# Patient Record
Sex: Male | Born: 1935 | Race: White | Hispanic: No | Marital: Single | Smoking: Never smoker
Health system: Southern US, Community
[De-identification: ages and names within clinical notes are randomized; demographics above are authoritative.]

## PROBLEM LIST (undated history)

## (undated) DIAGNOSIS — I10 Essential (primary) hypertension: Secondary | ICD-10-CM

## (undated) DIAGNOSIS — I509 Heart failure, unspecified: Secondary | ICD-10-CM

## (undated) DIAGNOSIS — E785 Hyperlipidemia, unspecified: Secondary | ICD-10-CM

## (undated) HISTORY — DX: Essential (primary) hypertension: I10

## (undated) HISTORY — DX: Heart failure, unspecified: I50.9

## (undated) HISTORY — DX: Hyperlipidemia, unspecified: E78.5

---

## 2020-12-10 ENCOUNTER — Other Ambulatory Visit: Payer: Self-pay

## 2020-12-10 ENCOUNTER — Ambulatory Visit (INDEPENDENT_AMBULATORY_CARE_PROVIDER_SITE_OTHER): Payer: Medicare HMO | Admitting: Family Medicine

## 2020-12-10 ENCOUNTER — Encounter: Payer: Self-pay | Admitting: Family Medicine

## 2020-12-10 VITALS — BP 118/72 | HR 76 | Wt 234.0 lb

## 2020-12-10 DIAGNOSIS — E785 Hyperlipidemia, unspecified: Secondary | ICD-10-CM

## 2020-12-10 DIAGNOSIS — Z7689 Persons encountering health services in other specified circumstances: Secondary | ICD-10-CM | POA: Diagnosis not present

## 2020-12-10 DIAGNOSIS — R739 Hyperglycemia, unspecified: Secondary | ICD-10-CM

## 2020-12-10 DIAGNOSIS — I10 Essential (primary) hypertension: Secondary | ICD-10-CM | POA: Insufficient documentation

## 2020-12-10 DIAGNOSIS — Z23 Encounter for immunization: Secondary | ICD-10-CM | POA: Diagnosis not present

## 2020-12-10 DIAGNOSIS — R7303 Prediabetes: Secondary | ICD-10-CM

## 2020-12-10 DIAGNOSIS — R4589 Other symptoms and signs involving emotional state: Secondary | ICD-10-CM

## 2020-12-10 DIAGNOSIS — I5020 Unspecified systolic (congestive) heart failure: Secondary | ICD-10-CM

## 2020-12-10 NOTE — Assessment & Plan Note (Signed)
PHQ-9 score of 11.  Patient feels that he can manage his mood fine with talking to his son.  Declines therapy.  Resources provided should he change his mind.

## 2020-12-10 NOTE — Assessment & Plan Note (Signed)
On statin. - lipid panel

## 2020-12-10 NOTE — Assessment & Plan Note (Addendum)
Well-controlled at today's visit, will continue current regimen. - BMP

## 2020-12-10 NOTE — Assessment & Plan Note (Signed)
Recently diagnosed, EF 40% per son.  On appropriate treatment with GDMT, though interestingly is not on ACEi/ARB.  We will explore this further at follow-up visit. - continue current medications - handicap placard form filled out - patient to schedule RN visit for ambulatory pulse ox to evaluate need for home O2 - referral placed to heart failure clinic

## 2020-12-10 NOTE — Assessment & Plan Note (Signed)
History of prediabetes per son, on glipizide. - A1c - continue glipizide

## 2020-12-10 NOTE — Addendum Note (Signed)
Addended by: Georges Lynch T on: 12/10/2020 12:53 PM   Modules accepted: Orders, SmartSet

## 2020-12-10 NOTE — Patient Instructions (Addendum)
It was nice seeing you today!  I have sent a referral for the heart failure clinic.  Below are some therapy resources if you change your mind.  You can schedule a nurse visit for an oxygen test.  Please schedule a separate follow-up appointment with me in 2 weeks.  Stay well, Tristan Deeds, MD [facility] [phone]    Therapy and Counseling Resources Most providers on this list will take Medicaid. Patients with commercial insurance or Medicare should contact their insurance company to get a list of in network providers.  [facility]:Psychiatry and Counseling [address] [place], [place] [address] [phone]  [facility]  [address] Suite [place], [place] [address]      [phone]  [facility] [address]  [place], [place] [address] [phone]  [facility] [address]  [place], [place] [address]       [phone]      Jovita Kussmaul [facility] [address], [place], [place] [phone]  [facility]:  [address] [place] [phone]  [facility]:  [address] [place] [phone]  [facility] (under & uninsured) [address]   [place] [phone]    [email or web address]    [facility] [address] . [place]    [phone]  [facility] -[address]     Phone:  [phone]     [facility]  [address]  [phone]   [facility] [address]      [place], [place] [phone]  [facility]: [address] [place], [place], [place]  [phone]   [facility] (Spanish therapist) [email or web address]  [address]   [place] [address]    [phone]    [facility]   [address],   [place], [place]  [phone]   [facility]  [address] [place]  [phone]  [facility]  [address] [place], [place]        [phone]  Open Access/Walk In Clinic under & uninsured  [facility]  [address] [place], [place] [phone] Crisis [phone]  [facility],  (Spanish)   [address], [place]: [phone]) 8:30 - 12; 1 - 2:30  [facility] [employer],  [address] [place], [place]    ([phone]):8:30 - 12; 2 - 3PM  [facility] [employer],  [address]  [place]; [phone]):   Mon - Fri 8 AM - 5 PM  [facility] [address] [place]  MWF 12:30 to 3:00 or call to schedule an appointment  [phone]  Specific Provider options Psychology Today  [email or web address].psychologytoday.com/us 1. click on find a therapist  2. enter your zip code 3. left side and select or tailor a therapist for your specific need.   [facility] Provider Directory [email or web address]  (Medicaid)   Follow all drop down to find a provider  Social Support program [facility] [phone] or [employer] [address], [place], [place] Recovery support and educational   24- Hour Availability:  .  Marland Kitchen [facility]  . [address] [place], [place] [employer] [phone]  Crisis [phone]  . [facility] [phone]  [facility] Crisis Service  [phone]   . [facility] [employer]  [phone] (after hours)  . Therapeutic Alternative/Mobile Crisis   [phone]  . [place] National Suicide Hotline  8323532569 (TALK)  . Call 911 or go to emergency room  . [employer]  [phone]);  [place] and [employer]   . [facility]  [phone]); [place], [place], [place], [place], [place],  [place], [place]

## 2020-12-10 NOTE — Progress Notes (Signed)
SUBJECTIVE:   CHIEF COMPLAINT / HPI: Establish care  Recently moved from [place] to stay with son, [name] due to health issues.  Previously was living alone.  CHF Medications: Metoprolol succinate 50 mg, furosemide 20 mg, spironolactone 25 mg, hydralazine 10 mg 3 times daily, isosorbide dinitrate 5 mg 3 times daily Diagnosed earlier this year in September 2021, was hospitalized for this Son believes his EF was 40% Son has been diligent about sodium intake He was not seeing an outpatient cardiologist before he moved but would've established with one if he had stayed He has been trying to establish with the heart failure clinic here, but has not received a call back yet - requesting referral Has had a handicap placard for the past few years due to inability to walk more than 100 yards without becoming short of breath - requesting handicap placard Requesting home O2 - states he felt pretty good on oxygen while in the hospital, but feeling more short of breath after being discharged from the hospital Requesting cheaper alternative to isosorbide dinitrate, 90 pills apparently cost $140 for them  Depressed mood Patient reporting little interest or pleasure in doing things as well as feeling down, depressed, hopeless He feels frustrated about his decline in functional status over the past 1 year due to medical conditions He has a great relationship with his son and is open to talking to him about things Son has been trying to get him involved more in activities and not letting him lay around the house too much Not interested in therapy at this time  Social history Lives at home with adult son Denies smoking, alcohol, and recreational drug use No regular exercise  PERTINENT  PMH / PSH: CHF, HTN, HLD, MI  OBJECTIVE:   BP 118/72   Pulse 76   Wt 234 lb (106.1 kg)   SpO2 94%   General: Elderly male, NAD CV: RRR, no murmurs Pulm: CTAB, no wheezes or rales Ext: 2+ pitting edema up to  knees bilaterally  Depression screen [facility] 2/9 12/10/2020  Decreased Interest 2  Down, Depressed, Hopeless 1  PHQ - 2 Score 3  Altered sleeping 3  Tired, decreased energy 3  Change in appetite 2  Feeling bad or failure about yourself  0  Trouble concentrating 0  Moving slowly or fidgety/restless 0  Suicidal thoughts 0  PHQ-9 Score 11  Difficult doing work/chores Not difficult at all     ASSESSMENT/PLAN:   HFrEF (heart failure with reduced ejection fraction) (HCC) Recently diagnosed, EF 40% per son.  On appropriate treatment with GDMT, though interestingly is not on ACEi/ARB.  We will explore this further at follow-up visit. - continue current medications - handicap placard form filled out - patient to schedule RN visit for ambulatory pulse ox to evaluate need for home O2 - referral placed to heart failure clinic  Primary hypertension Well-controlled at today's visit, will continue current regimen. - BMP  Pre-diabetes History of prediabetes per son, on glipizide. - A1c - continue glipizide  Depressed mood PHQ-9 score of 11.  Patient feels that he can manage his mood fine with talking to his son.  Declines therapy.  Resources provided should he change his mind.  Hyperlipidemia On statin. - lipid panel   HCM - flu shot given - PCV13 given - covid vaccine declined, patient will consider (he was told by previous doctors not to get it due to his heart) - Tdap about 5 years ago per patient, will obtain  records  F/u 2 weeks  Littie Deeds, MD [facility]

## 2020-12-11 LAB — BASIC METABOLIC PANEL
BUN/Creatinine Ratio: 13 (ref 10–24)
BUN: 22 mg/dL (ref 8–27)
CO2: 25 mmol/L (ref 20–29)
Calcium: 9.5 mg/dL (ref 8.6–10.2)
Chloride: 102 mmol/L (ref 96–106)
Creatinine, Ser: 1.73 mg/dL — ABNORMAL HIGH (ref 0.76–1.27)
GFR calc Af Amer: 42 mL/min/{1.73_m2} — ABNORMAL LOW (ref >59)
GFR calc non Af Amer: 36 mL/min/{1.73_m2} — ABNORMAL LOW (ref >59)
Glucose: 132 mg/dL — ABNORMAL HIGH (ref 65–99)
Potassium: 4.4 mmol/L (ref 3.5–5.2)
Sodium: 141 mmol/L (ref 134–144)

## 2020-12-11 LAB — LIPID PANEL
Chol/HDL Ratio: 2.2 ratio (ref 0.0–5.0)
Cholesterol, Total: 108 mg/dL (ref 100–199)
HDL: 50 mg/dL (ref >39)
LDL Chol Calc (NIH): 47 mg/dL (ref 0–99)
Triglycerides: 39 mg/dL (ref 0–149)
VLDL Cholesterol Cal: 11 mg/dL (ref 5–40)

## 2020-12-11 LAB — HEMOGLOBIN A1C
Est. average glucose Bld gHb Est-mCnc: 123 mg/dL
Hgb A1c MFr Bld: 5.9 % — ABNORMAL HIGH (ref 4.8–5.6)

## 2020-12-15 ENCOUNTER — Encounter: Payer: Self-pay | Admitting: Family Medicine

## 2021-01-02 ENCOUNTER — Ambulatory Visit (INDEPENDENT_AMBULATORY_CARE_PROVIDER_SITE_OTHER): Payer: Self-pay | Admitting: Family Medicine

## 2021-01-02 ENCOUNTER — Ambulatory Visit (INDEPENDENT_AMBULATORY_CARE_PROVIDER_SITE_OTHER): Payer: Self-pay

## 2021-01-02 ENCOUNTER — Encounter: Payer: Self-pay | Admitting: Family Medicine

## 2021-01-02 ENCOUNTER — Other Ambulatory Visit: Payer: Self-pay

## 2021-01-02 ENCOUNTER — Ambulatory Visit: Payer: Medicare HMO | Admitting: Family Medicine

## 2021-01-02 VITALS — BP 114/66 | HR 69 | Wt 226.8 lb

## 2021-01-02 VITALS — HR 60

## 2021-01-02 DIAGNOSIS — R7303 Prediabetes: Secondary | ICD-10-CM

## 2021-01-02 DIAGNOSIS — I5020 Unspecified systolic (congestive) heart failure: Secondary | ICD-10-CM

## 2021-01-02 DIAGNOSIS — E785 Hyperlipidemia, unspecified: Secondary | ICD-10-CM

## 2021-01-02 DIAGNOSIS — R7989 Other specified abnormal findings of blood chemistry: Secondary | ICD-10-CM

## 2021-01-02 MED ORDER — HYDRALAZINE HCL 10 MG PO TABS: 10.0000 mg | ORAL_TABLET | Freq: Three times a day (TID) | ORAL | 2 refills | Status: DC

## 2021-01-02 MED ORDER — ISOSORBIDE DINITRATE 5 MG PO TABS: 5.0000 mg | ORAL_TABLET | Freq: Three times a day (TID) | ORAL | 2 refills | Status: DC

## 2021-01-02 MED ORDER — FUROSEMIDE 40 MG PO TABS: 40.0000 mg | ORAL_TABLET | Freq: Every day | ORAL | 2 refills | Status: DC

## 2021-01-02 MED ORDER — SPIRONOLACTONE 25 MG PO TABS: 25.0000 mg | ORAL_TABLET | Freq: Every day | ORAL | 2 refills | Status: DC

## 2021-01-02 MED ORDER — GLIPIZIDE 5 MG PO TABS: 5.0000 mg | ORAL_TABLET | Freq: Every day | ORAL | 2 refills | Status: DC

## 2021-01-02 MED ORDER — METOPROLOL SUCCINATE ER 50 MG PO TB24: 50.0000 mg | ORAL_TABLET | Freq: Every day | ORAL | 2 refills | Status: DC

## 2021-01-02 MED ORDER — ATORVASTATIN CALCIUM 40 MG PO TABS: 40.0000 mg | ORAL_TABLET | Freq: Every day | ORAL | 2 refills | Status: DC

## 2021-01-02 NOTE — Patient Instructions (Addendum)
It was nice seeing you today!  I am increasing your Lasix to 40 mg daily.  This should help with the swelling in your legs.  I would recommend continuing to elevate her legs and to wear compression stockings as much as you can.  I have refilled your other medications.  I will update you with the results of your blood work.  Stay well, Littie Deeds, MD [facility] [phone]

## 2021-01-02 NOTE — Progress Notes (Signed)
Patient presents to nurse clinic with son for pulse ox screening for home O2.   Resting SpO2-97 % on RA Exertional SPO2 92-94% RA  Per insurance guidelines, patient would not qualify due to SpO2 not dropping below 88% on RA.   Patient has appointment today with Dr. Wynelle Link and will address further concerns for oxygen requirements with provider.   Tristan Prude, RN

## 2021-01-02 NOTE — Progress Notes (Signed)
    SUBJECTIVE:   CHIEF COMPLAINT / HPI:   HFrEF Medications: Metoprolol succinate 50 mg, furosemide 20 mg, spironolactone 25 mg, hydralazine 10 mg 3 times daily, isosorbide dinitrate 5 mg 3 times daily Diagnosed 08/2020, son believes EF was 40% Referred to heart failure clinic at last visit. First appt made 2/16. Requesting home O2, ambulatory O2 test today. Did not qualify for home O2.   PERTINENT  PMH / PSH: HFrEF, pre-diabetes, HTN  OBJECTIVE:   BP 114/66   Pulse 69   Wt 226 lb 12.8 oz (102.9 kg)   SpO2 94%   General: Elderly male, NAD CV: RRR, no murmurs Pulm: CTAB, no wheezes or rales Ext: WWP, 2+ pitting edema to knees bilaterally  ASSESSMENT/PLAN:   HFrEF (heart failure with reduced ejection fraction) (HCC) Still having some dyspnea though ambulatory oxygen test demonstrated no need for home supplemental oxygen at this time.  Is already on appropriate GDMT with the exception of ACE inhibitor/ARB.  Could consider adding on low-dose ACE inhibitor/ARB pending BMP.  On exam, he has significant pitting edema though no rales appreciated.  Would likely benefit from increased diuresis so we will go ahead and increase furosemide.  He has his initial appointment with the heart failure clinic in 1 month. - increase furosemide from 20 mg to 40 mg daily - otherwise continue current regimen   Elevated serum creatinine Cr 1.73 at last visit.  Patient with history of kidney stones but denies any known history of kidney disease.  Unclear if this represents AKI or new onset CKD.  Cardiorenal syndrome may be contributing given recently diagnosed HFrEF.  May need nephrology referral if kidney function does not improve, GFR in the 30s.  May potentially benefit from ACEi/ARB. - repeat BMP - UA  Littie Deeds, MD [facility]

## 2021-01-02 NOTE — Assessment & Plan Note (Signed)
Still having some dyspnea though ambulatory oxygen test demonstrated no need for home supplemental oxygen at this time.  Is already on appropriate GDMT with the exception of ACE inhibitor/ARB.  Could consider adding on low-dose ACE inhibitor/ARB pending BMP.  On exam, he has significant pitting edema though no rales appreciated.  Would likely benefit from increased diuresis so we will go ahead and increase furosemide.  He has his initial appointment with the heart failure clinic in 1 month. - increase furosemide from 20 mg to 40 mg daily - otherwise continue current regimen

## 2021-01-03 LAB — URINALYSIS, COMPLETE
Bilirubin, UA: NEGATIVE
Glucose, UA: NEGATIVE
Ketones, UA: NEGATIVE
Leukocytes,UA: NEGATIVE
Nitrite, UA: NEGATIVE
RBC, UA: NEGATIVE
Specific Gravity, UA: 1.016 (ref 1.005–1.030)
Urobilinogen, Ur: 0.2 mg/dL (ref 0.2–1.0)
pH, UA: 5.5 (ref 5.0–7.5)

## 2021-01-03 LAB — BASIC METABOLIC PANEL
BUN/Creatinine Ratio: 21 (ref 10–24)
BUN: 38 mg/dL — ABNORMAL HIGH (ref 8–27)
CO2: 24 mmol/L (ref 20–29)
Calcium: 9.2 mg/dL (ref 8.6–10.2)
Chloride: 106 mmol/L (ref 96–106)
Creatinine, Ser: 1.81 mg/dL — ABNORMAL HIGH (ref 0.76–1.27)
GFR calc Af Amer: 39 mL/min/{1.73_m2} — ABNORMAL LOW (ref >59)
GFR calc non Af Amer: 34 mL/min/{1.73_m2} — ABNORMAL LOW (ref >59)
Glucose: 96 mg/dL (ref 65–99)
Potassium: 4.7 mmol/L (ref 3.5–5.2)
Sodium: 144 mmol/L (ref 134–144)

## 2021-01-03 LAB — MICROALBUMIN / CREATININE URINE RATIO
Creatinine, Urine: 49.1 mg/dL
Microalb/Creat Ratio: 934 mg/g creat — ABNORMAL HIGH (ref 0–29)
Microalbumin, Urine: 458.5 ug/mL

## 2021-01-03 LAB — MICROSCOPIC EXAMINATION
Bacteria, UA: NONE SEEN
Casts: NONE SEEN /lpf
Epithelial Cells (non renal): NONE SEEN /hpf (ref 0–10)
RBC, Urine: NONE SEEN /hpf (ref 0–2)

## 2021-01-06 ENCOUNTER — Telehealth: Payer: Self-pay | Admitting: Family Medicine

## 2021-01-06 DIAGNOSIS — N1832 Chronic kidney disease, stage 3b: Secondary | ICD-10-CM | POA: Insufficient documentation

## 2021-01-06 MED ORDER — LOSARTAN POTASSIUM 25 MG PO TABS: 25.0000 mg | ORAL_TABLET | Freq: Every day | ORAL | 3 refills | Status: DC

## 2021-01-06 NOTE — Telephone Encounter (Signed)
Discussed lab results with patient and son [name].  Creatinine remains elevated with GFR 34 and microalbumin/creatinine ratio > 300, confirming that this patient has newly diagnosed CKD stage IIIb.  Given presence of proteinuria (as well as history of CHF), will initiate low-dose ARB for renal protection.  Additionally, since I have recently increased his furosemide, will plan to recheck electrolytes and kidney function in 1 week.  Also obtain CBC for baseline hemoglobin given new CKD.  Lab appointment scheduled for Thursday, February 3 at 8:30 AM.  I will also place referral for nephrology.

## 2021-01-15 ENCOUNTER — Other Ambulatory Visit: Payer: Medicare HMO

## 2021-01-15 ENCOUNTER — Other Ambulatory Visit: Payer: Self-pay

## 2021-01-15 DIAGNOSIS — N1832 Chronic kidney disease, stage 3b: Secondary | ICD-10-CM | POA: Diagnosis not present

## 2021-01-16 LAB — CBC
Hematocrit: 38.6 % (ref 37.5–51.0)
Hemoglobin: 12.8 g/dL — ABNORMAL LOW (ref 13.0–17.7)
MCH: 31.5 pg (ref 26.6–33.0)
MCHC: 33.2 g/dL (ref 31.5–35.7)
MCV: 95 fL (ref 79–97)
Platelets: 161 10*3/uL (ref 150–450)
RBC: 4.06 x10E6/uL — ABNORMAL LOW (ref 4.14–5.80)
RDW: 12.9 % (ref 11.6–15.4)
WBC: 6.2 10*3/uL (ref 3.4–10.8)

## 2021-01-16 LAB — RENAL FUNCTION PANEL
Albumin: 4 g/dL (ref 3.6–4.6)
BUN/Creatinine Ratio: 26 — ABNORMAL HIGH (ref 10–24)
BUN: 46 mg/dL — ABNORMAL HIGH (ref 8–27)
CO2: 23 mmol/L (ref 20–29)
Calcium: 9.3 mg/dL (ref 8.6–10.2)
Chloride: 103 mmol/L (ref 96–106)
Creatinine, Ser: 1.75 mg/dL — ABNORMAL HIGH (ref 0.76–1.27)
GFR calc Af Amer: 41 mL/min/{1.73_m2} — ABNORMAL LOW (ref >59)
GFR calc non Af Amer: 35 mL/min/{1.73_m2} — ABNORMAL LOW (ref >59)
Glucose: 111 mg/dL — ABNORMAL HIGH (ref 65–99)
Phosphorus: 3.3 mg/dL (ref 2.8–4.1)
Potassium: 4.3 mmol/L (ref 3.5–5.2)
Sodium: 141 mmol/L (ref 134–144)

## 2021-01-20 ENCOUNTER — Encounter: Payer: Self-pay | Admitting: Family Medicine

## 2021-01-27 NOTE — Progress Notes (Signed)
ADVANCED HF CLINIC CONSULT NOTE  Referring Physician: Dr. Wynelle Link Primary Care: Littie Deeds, MD Primary Cardiologist: New  HPI:  [name] is an 85 y/o male with HTN, DM2, HF referred by Dr. Wynelle Link for further evaluation of his HF.   At then end of 2021 moved from [place] to stay with son, [name] due to health issues.  Previously was living alone.  Apparently was diagnosed with HF in 9/21. Was in hospital for 1-2 weeks. Had a heart cath and cMRI.  EF ~40%. I do not have any records to give me the details. Says they were talking about CABG but decided he was not a good candidate and they wanted to pursue medical therapy.   Here with his son. Says he feels ok. Gets tired easily but is building his stamina steadily. No CP. SOB with mild activity. No edema, orthopnea or PND. CBGs around 120-130    Review of Systems: [y] = yes, [ ]  = no   General: Weight gain [ ] ; Weight loss [ ] ; Anorexia [ ] ; Fatigue ]; Fever [ ] ; Chills [ ] ; Weakness [ ]   Cardiac: Chest pain/pressure [ ] ; Resting SOB [ ] ; Exertional SOB [ y]; Orthopnea [ ] ; Pedal Edema [ ] ; Palpitations [ ] ; Syncope [ ] ; Presyncope [ ] ; Paroxysmal nocturnal dyspnea[ ]   Pulmonary: Cough [ ] ; Wheezing[ ] ; Hemoptysis[ ] ; Sputum [ ] ; Snoring [ ]   GI: Vomiting[ ] ; Dysphagia[ ] ; Melena[ ] ; Hematochezia [ ] ; Heartburn[ ] ; Abdominal pain [ ] ; Constipation [ ] ; Diarrhea [ ] ; BRBPR [ ]   GU: Hematuria[ ] ; Dysuria [ ] ; Nocturia[ ]   Vascular: Pain in legs with walking [ ] ; Pain in feet with lying flat [ ] ; Non-healing sores [ ] ; Stroke [ ] ; TIA [ ] ; Slurred speech [ ] ;  Neuro: Headaches[ ] ; Vertigo[ ] ; Seizures[ ] ; Paresthesias[ ] ;Blurred vision [ ] ; Diplopia [ ] ; Vision changes [ ]   Ortho/Skin: Arthritis [y]; Joint pain ]; Muscle pain [ ] ; Joint swelling [ ] ; Back Pain [ ] ; Rash [ ]   Psych: Depression[ ] ; Anxiety[ ]   Heme: Bleeding problems [ ] ; Clotting disorders [ ] ; Anemia [ ]   Endocrine: Diabetes ]; Thyroid dysfunction[ ]    Past Medical  History:  Diagnosis Date  . Congestive heart failure (CHF) (HCC)   . Hyperlipidemia   . Hypertension     Current Outpatient Medications  Medication Sig Dispense Refill  . atorvastatin (LIPITOR) 40 MG tablet Take 1 tablet (40 mg total) by mouth at bedtime. 90 tablet 2  . furosemide (LASIX) 40 MG tablet Take 1 tablet (40 mg total) by mouth daily. 30 tablet 2  . glipiZIDE (GLUCOTROL) 5 MG tablet Take 1 tablet (5 mg total) by mouth daily. 30 tablet 2  . hydrALAZINE (APRESOLINE) 10 MG tablet Take 1 tablet (10 mg total) by mouth 3 (three) times daily. 90 tablet 2  . isosorbide dinitrate (ISORDIL) 5 MG tablet Take 1 tablet (5 mg total) by mouth 3 (three) times daily. 90 tablet 2  . losartan (COZAAR) 25 MG tablet Take 1 tablet (25 mg total) by mouth at bedtime. 90 tablet 3  . metoprolol succinate (TOPROL-XL) 50 MG 24 hr tablet Take 1 tablet (50 mg total) by mouth daily. 30 tablet 2  . spironolactone (ALDACTONE) 25 MG tablet Take 1 tablet (25 mg total) by mouth daily. 30 tablet 2   No current facility-administered medications for this encounter.    Not on File    Social History  Socioeconomic History  . Marital status: Single    Spouse name: Not on file  . Number of children: Not on file  . Years of education: Not on file  . Highest education level: Not on file  Occupational History  . Not on file  Tobacco Use  . Smoking status: Never Smoker  . Smokeless tobacco: Never Used  Substance and Sexual Activity  . Alcohol use: Not Currently  . Drug use: Never  . Sexual activity: Not on file  Other Topics Concern  . Not on file  Social History Narrative  . Not on file   Social Determinants of Health   Financial Resource Strain: Not on file  Food Insecurity: Not on file  Transportation Needs: Not on file  Physical Activity: Not on file  Stress: Not on file  Social Connections: Not on file  Intimate Partner Violence: Not on file      Family History  Problem Relation Age of  Onset  . Diabetes Mother   . Hypertension Mother   . Alzheimer's disease Sister   . Diabetes Brother   . Hypertension Brother   . Stroke Brother   . Cancer Brother     Vitals:   01/28/21 1153  BP: 140/80  Pulse: 66  SpO2: 97%  Weight: 99 kg (218 lb 3.2 oz)    PHYSICAL EXAM: General:  Elderly. No respiratory difficulty HEENT: normal Neck: supple. JVP 9 + prominent cCV waves Carotids 2+ bilat; no bruits. No lymphadenopathy or thryomegaly appreciated. Cor: PMI nondisplaced. Regular rate & rhythm. No rubs, gallops or murmurs. Lungs: clear Abdomen: soft, nontender, nondistended. No hepatosplenomegaly. No bruits or masses. Good bowel sounds. Extremities: no cyanosis, clubbing, rash, 3+ edema Neuro: alert & oriented x 3, cranial nerves grossly intact. moves all 4 extremities w/o difficulty. Affect pleasant.  ECG: NSR 64 Anterolateral qs Personally reviewed   ASSESSMENT & PLAN:  1. 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 Appears to be due to iCM. Turned down for CABG -> medical therapy - EF 40% by report - NYHA III - but improving - Volume status markedly elevated on lasix 40 daily - Continue compression hose - Continue spiro 25 - Continue losartan 25 daily - Continue Toprol 50 daily - Continue hydralazine 10 tid  - Volume status markedly elevated - Will add Jardiance 10 and see if it helps with diuresis. If not will need to increase lasix - Refer to PharmD Clinic for med titration. Would start next week with stopping hydral and losartan and switching to Entresto 49/51 bid if renal function and BP permit - Check echo and get records from [place]  2. HTN - BP up. Changes as above  3. CAD - apparently has 3v CAD by cath 9/21 in [place]  - Turned down for CABG -> medical therapy - No s/s angina.  - Await records - Continue statin - Start ASA  3. DM2 - hgba1c 5.9% 12/21 - switch glipizide to Jardiance  4. CKD 3b  - last creatinine 1.7 -  start jardiance - recheck today - watch closely with diuresis - will need referral to [facility]    Total time spent 45 minutes. Over half that time spent discussing above.   Arvilla Meres, MD  10:27 PM

## 2021-01-28 ENCOUNTER — Encounter (HOSPITAL_COMMUNITY): Payer: Self-pay | Admitting: Internal Medicine

## 2021-01-28 ENCOUNTER — Ambulatory Visit (HOSPITAL_COMMUNITY)
Admission: RE | Admit: 2021-01-28 | Discharge: 2021-01-28 | Disposition: A | Discharge status: Home / Self Care | Payer: Medicare HMO | Source: Ambulatory Visit | Attending: Internal Medicine | Admitting: Internal Medicine

## 2021-01-28 ENCOUNTER — Other Ambulatory Visit: Payer: Self-pay

## 2021-01-28 VITALS — BP 140/80 | HR 66 | Wt 218.2 lb

## 2021-01-28 DIAGNOSIS — I5022 Chronic systolic (congestive) heart failure: Secondary | ICD-10-CM | POA: Diagnosis not present

## 2021-01-28 DIAGNOSIS — Z79899 Other long term (current) drug therapy: Secondary | ICD-10-CM | POA: Diagnosis not present

## 2021-01-28 DIAGNOSIS — E1122 Type 2 diabetes mellitus with diabetic chronic kidney disease: Secondary | ICD-10-CM | POA: Diagnosis not present

## 2021-01-28 DIAGNOSIS — I130 Hypertensive heart and chronic kidney disease with heart failure and stage 1 through stage 4 chronic kidney disease, or unspecified chronic kidney disease: Secondary | ICD-10-CM | POA: Diagnosis not present

## 2021-01-28 DIAGNOSIS — Z7984 Long term (current) use of oral hypoglycemic drugs: Secondary | ICD-10-CM | POA: Insufficient documentation

## 2021-01-28 DIAGNOSIS — I5020 Unspecified systolic (congestive) heart failure: Secondary | ICD-10-CM | POA: Diagnosis not present

## 2021-01-28 DIAGNOSIS — E785 Hyperlipidemia, unspecified: Secondary | ICD-10-CM | POA: Insufficient documentation

## 2021-01-28 DIAGNOSIS — I2510 Atherosclerotic heart disease of native coronary artery without angina pectoris: Secondary | ICD-10-CM

## 2021-01-28 DIAGNOSIS — Z8249 Family history of ischemic heart disease and other diseases of the circulatory system: Secondary | ICD-10-CM | POA: Diagnosis not present

## 2021-01-28 DIAGNOSIS — N1832 Chronic kidney disease, stage 3b: Secondary | ICD-10-CM | POA: Insufficient documentation

## 2021-01-28 DIAGNOSIS — I10 Essential (primary) hypertension: Secondary | ICD-10-CM | POA: Diagnosis not present

## 2021-01-28 LAB — BASIC METABOLIC PANEL
Anion gap: 8 (ref 5–15)
BUN: 39 mg/dL — ABNORMAL HIGH (ref 8–23)
CO2: 28 mmol/L (ref 22–32)
Calcium: 9.3 mg/dL (ref 8.9–10.3)
Chloride: 104 mmol/L (ref 98–111)
Creatinine, Ser: 1.81 mg/dL — ABNORMAL HIGH (ref 0.61–1.24)
GFR, Estimated: 37 mL/min — ABNORMAL LOW (ref >60)
Glucose, Bld: 109 mg/dL — ABNORMAL HIGH (ref 70–99)
Potassium: 4.9 mmol/L (ref 3.5–5.1)
Sodium: 140 mmol/L (ref 135–145)

## 2021-01-28 LAB — BRAIN NATRIURETIC PEPTIDE: B Natriuretic Peptide: 1548.7 pg/mL — ABNORMAL HIGH (ref 0.0–100.0)

## 2021-01-28 MED ORDER — LOSARTAN POTASSIUM 25 MG PO TABS: 25.0000 mg | ORAL_TABLET | Freq: Every day | ORAL | 2 refills | Status: DC

## 2021-01-28 MED ORDER — FUROSEMIDE 40 MG PO TABS: 40.0000 mg | ORAL_TABLET | Freq: Every day | ORAL | 2 refills | Status: DC

## 2021-01-28 MED ORDER — SPIRONOLACTONE 25 MG PO TABS: 25.0000 mg | ORAL_TABLET | Freq: Every day | ORAL | 2 refills | Status: DC

## 2021-01-28 MED ORDER — METOPROLOL SUCCINATE ER 50 MG PO TB24: 50.0000 mg | ORAL_TABLET | Freq: Every day | ORAL | 2 refills | Status: AC

## 2021-01-28 MED ORDER — ISOSORBIDE MONONITRATE ER 30 MG PO TB24: 30.0000 mg | ORAL_TABLET | Freq: Every day | ORAL | 2 refills | Status: AC

## 2021-01-28 MED ORDER — EMPAGLIFLOZIN 10 MG PO TABS: 10.0000 mg | ORAL_TABLET | Freq: Every day | ORAL | 3 refills | Status: DC

## 2021-01-28 MED ORDER — HYDRALAZINE HCL 10 MG PO TABS: 10.0000 mg | ORAL_TABLET | Freq: Three times a day (TID) | ORAL | 2 refills | Status: DC

## 2021-01-28 MED ORDER — EMPAGLIFLOZIN 10 MG PO TABS: 10.0000 mg | ORAL_TABLET | Freq: Every day | ORAL | 2 refills | Status: AC

## 2021-01-28 MED ORDER — ISOSORBIDE MONONITRATE ER 30 MG PO TB24: 30.0000 mg | ORAL_TABLET | Freq: Every day | ORAL | 3 refills | Status: DC

## 2021-01-28 MED ORDER — ATORVASTATIN CALCIUM 40 MG PO TABS: 40.0000 mg | ORAL_TABLET | Freq: Every day | ORAL | 2 refills | Status: AC

## 2021-01-28 NOTE — Addendum Note (Signed)
Encounter addended by: Dolores Patty, MD on: 01/28/2021 1:25 PM  Actions taken: Level of Service modified, Visit diagnoses modified

## 2021-01-28 NOTE — Patient Instructions (Addendum)
START Jardiance 10mg  (1 tablet) daily  STOP Glipizide   STOP Isurdil  START Imdur 30mg  (1 tablet) daily  Labs done today, your results will be available in MyChart, we will contact you for abnormal readings.  Your physician recommends that you schedule a follow-up appointment in: 1 week with pharmacy and an echocardiogram  Your physician has requested that you have an echocardiogram. Echocardiography is a painless test that uses sound waves to create images of your heart. It provides your doctor with information about the size and shape of your heart and how well your heart's chambers and valves are working. This procedure takes approximately one hour. There are no restrictions for this procedure.  Your physician recommends that you schedule a follow-up appointment in: 2 months with Dr.  If you have any questions or concerns before your next appointment please send a message through [facility] or call our office at [phone].    TO LEAVE A MESSAGE FOR THE NURSE SELECT OPTION 2, PLEASE LEAVE A MESSAGE INCLUDING: . YOUR NAME . DATE OF BIRTH . CALL BACK NUMBER . REASON FOR CALL**this is important as we prioritize the call backs  YOU WILL RECEIVE A CALL BACK THE SAME DAY AS LONG AS YOU CALL BEFORE 4:00 PM

## 2021-02-06 NOTE — Progress Notes (Signed)
Referring Physician: Dr. Wynelle Link Primary Care: Littie Deeds, MD HF Cardiologist: Dr. Gala Romney  HPI:  [name] is an 85 y/o male with HTN, DM2, HF referred by Dr. Wynelle Link for further evaluation of his HF.   At then end of 2021 moved from [place] to stay with son, [name] due to health issues. Previously was living alone.  Apparently was diagnosed with HF in 9/21. Was in hospital for 1-2 weeks. Had a heart cath and cMRI.  EF ~40%. I do not have any records to give me the details. Says they were talking about CABG but decided he was not a good candidate and they wanted to pursue medical therapy.   Recently presented to HF Clinic for initial visit with Dr. Gala Romney. Stated he felt ok. Reported getting tired easily but was building his stamina steadily. No CP. SOB with mild activity. No edema, orthopnea or PND. CBGs around 120-130   Today he returns to HF clinic for pharmacist medication titration. Here with his son. At last visit with MD, Jardiance 10 mg daily was initiated and glipizide was discontinued. Overall he is feeling well today. No dizziness, lightheadedness, chest pain or palpitations. Says he does feel fatigued, especially after walking around [place], but this has actually improved. No issues with SOB if he doesn't walk too fast. He used to require a wheelchair/cart at [employer] but now he can walk the entire trip without stopping. Weight at home has been decreasing, likely due to extra diuresis with Jardiance and improved diet. Weight is down 15 lbs since last visit. No longer eating TV dinners and has significantly decreased sodium intake. He takes furosemide 40 mg daily and has not needed any extra. Has been wearing compression stockings and this has been helpful. No LEE, PND or orthopnea. Taking all medications as prescribed and tolerating all medications.    HF Medications: Metoprolol succinate 50 mg daily Losartan 25 mg daily Spironolactone 25 mg daily Jardiance 10 mg  daily Furosemide 40 mg daily Hydralazine 10 mg TID Isosorbide mononitrate 30 mg daily  Has the patient been experiencing any side effects to the medications prescribed?  no  Does the patient have any problems obtaining medications due to transportation or finances?   Not currently. Has [employer]. Able to afford the Jardiance but is concerned about affordability when having to pay for that and Entresto (plan to start today, see plan below). Will apply for [employer] Patient Assistance today for [facility] and place him on PAN waitlist.   Understanding of regimen: good Understanding of indications: good Potential of compliance: good Patient understands to avoid NSAIDs. Patient understands to avoid decongestants.    Pertinent Lab Values: . 01/28/2021: Serum creatinine 1.81, BUN 39, Potassium 4.9, Sodium 140  Vital Signs: . Weight: 203.8 lbs (last clinic weight: 218.2 lbs) . Blood pressure: 140/72  . Heart rate: 70   Assessment/Plan: 1. 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 Appears to be due to iCM. Turned down for CABG -> medical therapy - EF 40% by report - NYHA III - but improving. Volume status improved, euvolemic on exam. Weight down 15 lbs from last visit.  - Continue compression hose - Decrease furosemide to 20 mg (1/2 tablet) daily.  - Continue metoprolol succinate 50 mg daily - Stop losartan and start Entresto 49/51 mg BID. Repeat BMET in 14 days.  - Continue spironolactone 25 mg daily - Stop hydralazine.  - Echo today pending - Return to Pharmacy Clinic in 2 weeks  for additional medication titration and BMET.   2. HTN - BP up. Changes as above  3. CAD - apparently has 3v CAD by cath 9/21 in [place]  - Turned down for CABG -> medical therapy - No s/s angina.  - Await records - Continue aspirin, statin.   3. DM2 - hgba1c 5.9% 11/2020 - Continue Jardiance 10 mg daily  4. CKD 3b  - last creatinine 1.8 - Continue jardiance  10 mg daily - watch closely with diuresis - Referred to [facility]    Karle Plumber, PharmD, BCPS, BCCP, CPP Heart Failure Clinic Pharmacist [phone]

## 2021-02-18 ENCOUNTER — Ambulatory Visit (HOSPITAL_BASED_OUTPATIENT_CLINIC_OR_DEPARTMENT_OTHER)
Admission: RE | Admit: 2021-02-18 | Discharge: 2021-02-18 | Disposition: A | Discharge status: Home / Self Care | Payer: Medicare HMO | Source: Ambulatory Visit

## 2021-02-18 ENCOUNTER — Ambulatory Visit (HOSPITAL_COMMUNITY)
Admission: RE | Admit: 2021-02-18 | Discharge: 2021-02-18 | Disposition: A | Discharge status: Home / Self Care | Payer: Medicare HMO | Source: Ambulatory Visit | Attending: Cardiology | Admitting: Cardiology

## 2021-02-18 ENCOUNTER — Telehealth (HOSPITAL_COMMUNITY): Payer: Self-pay | Admitting: Pharmacy Technician

## 2021-02-18 ENCOUNTER — Other Ambulatory Visit: Payer: Self-pay

## 2021-02-18 VITALS — BP 140/72 | HR 70 | Wt 203.8 lb

## 2021-02-18 DIAGNOSIS — I5020 Unspecified systolic (congestive) heart failure: Secondary | ICD-10-CM

## 2021-02-18 DIAGNOSIS — I5022 Chronic systolic (congestive) heart failure: Secondary | ICD-10-CM

## 2021-02-18 DIAGNOSIS — I313 Pericardial effusion (noninflammatory): Secondary | ICD-10-CM | POA: Diagnosis not present

## 2021-02-18 DIAGNOSIS — I700 Atherosclerosis of aorta: Secondary | ICD-10-CM | POA: Diagnosis not present

## 2021-02-18 DIAGNOSIS — E119 Type 2 diabetes mellitus without complications: Secondary | ICD-10-CM | POA: Insufficient documentation

## 2021-02-18 DIAGNOSIS — E785 Hyperlipidemia, unspecified: Secondary | ICD-10-CM | POA: Diagnosis not present

## 2021-02-18 DIAGNOSIS — I110 Hypertensive heart disease with heart failure: Secondary | ICD-10-CM | POA: Insufficient documentation

## 2021-02-18 LAB — ECHOCARDIOGRAM COMPLETE
Area-P 1/2: 7.44 cm2
S' Lateral: 4.8 cm
Single Plane A4C EF: 28.5 %

## 2021-02-18 MED ORDER — FUROSEMIDE 40 MG PO TABS: 20.0000 mg | ORAL_TABLET | Freq: Every day | ORAL | 2 refills | Status: AC

## 2021-02-18 MED ORDER — SACUBITRIL-VALSARTAN 49-51 MG PO TABS: 1.0000 | ORAL_TABLET | Freq: Two times a day (BID) | ORAL | 3 refills | Status: DC

## 2021-02-18 NOTE — Progress Notes (Signed)
  Echocardiogram 2D Echocardiogram has been performed.  Tristan Hart 02/18/2021, 1:27 PM

## 2021-02-18 NOTE — Patient Instructions (Addendum)
It was a pleasure seeing you today!  MEDICATIONS: -We are changing your medications today -Stop Hydralazine -Stop Losartan -Start Entresto 49/51 mg (1 tablet) twice daily - Decrease furosemide to 20 mg (1/2 tab) daily -Call if you have questions about your medications.   NEXT APPOINTMENT: Return to clinic in 2 weeks with Pharmacy Clinic.  In general, to take care of your heart failure: -Limit your fluid intake to 2 Liters (half-gallon) per day.   -Limit your salt intake to ideally 2-3 grams (2000-3000 mg) per day. -Weigh yourself daily and record, and bring that "weight diary" to your next appointment.  (Weight gain of 2-3 pounds in 1 day typically means fluid weight.) -The medications for your heart are to help your heart and help you live longer.   -Please contact us before stopping any of your heart medications.  Call the clinic at [phone] with questions or to reschedule future appointments.

## 2021-02-18 NOTE — Telephone Encounter (Signed)
Patient was seen in clinic today and started on Entresto. Started an Landscape [employer] for [employer] assistance.  Will fax in once signatures are obtained.

## 2021-02-19 NOTE — Progress Notes (Signed)
Referring Physician: Dr. Wynelle Link Primary Care: Littie Deeds, MD HF Cardiologist: Dr. Gala Romney  HPI:  Tristan Hart is an 85 y/o male with HTN, DM2, HF referred by Dr. Wynelle Link for further evaluation of his HF.   At then end of 2021 moved from [place] to stay with son, Tristan Hart due to health issues. Previously was living alone.  Apparently was diagnosed with HF in 08/2020. Was in hospital for 1-2 weeks. Had a heart cath and cMRI.  EF ~40%. I do not have any records to give me the details. Says they were talking about CABG but decided he was not a good candidate and they wanted to pursue medical therapy.   Recently presented to HF Clinic for initial visit with Dr. Gala Romney. Stated he felt ok. Reported getting tired easily but was building his stamina steadily. No CP. SOB with mild activity. No edema, orthopnea or PND. CBGs around 120-130   Recently presented to HF Clinic for pharmacist medication titration. Came with his son. At last visit with MD, Jardiance 10 mg daily was initiated and glipizide was discontinued. Overall he was feeling well. No dizziness, lightheadedness, chest pain or palpitations. Stated he does feel fatigued, especially after walking around [place], but this had actually improved. No issues with SOB if he doesn't walk too fast. He used to require a wheelchair/cart at [employer] but now he can walk the entire trip without stopping. Weight at home had been decreasing, likely due to extra diuresis with Jardiance and improved diet. Weight was down 15 lbs since last visit. No longer eating TV dinners and had significantly decreased sodium intake. He reported taking furosemide 40 mg daily and had not needed any extra. Had been wearing compression stockings and these have been helpful. No LEE, PND or orthopnea.   Today he returns to HF clinic for pharmacist medication titration. At last visit with pharmacy clinic, losartan was stopped and Entresto 49/51 mg BID was initiated. Additionally, furosemide  was decreased to 20 mg daily and hydralazine was discontinued. Unfortunately, he never decreased furosemide to 20 mg daily (forgot to make the change).  Overall he is doing well today. No dizziness, lightheadedness, chest pain or palpitations. No issues with SOB if he doesn't walk too fast. Has not needed any extra furosemide. No LEE, PND or orthopnea. Wears compression stockings.    HF Medications: Metoprolol succinate 50 mg daily Entresto 49/51 mg BID Spironolactone 25 mg daily Jardiance 10 mg daily Furosemide 20 mg daily - taking 40 mg daily Isosorbide mononitrate 30 mg daily  Has the patient been experiencing any side effects to the medications prescribed?  no  Does the patient have any problems obtaining medications due to transportation or finances?   Not currently. Has [employer]. Able to afford the Jardiance but is concerned about affordability when having to pay for that and Entresto (plan to start today, see plan below).  [employer] Patient Assistance for Sherryll Burger is pending. He was placed on PAN waitlist. Will apply for Jardiance patient assistance today.   Understanding of regimen: good Understanding of indications: good Potential of compliance: good Patient understands to avoid NSAIDs. Patient understands to avoid decongestants.    Pertinent Lab Values: . 01/28/2021: Serum creatinine 1.81, BUN 39, Potassium 4.9, Sodium 140, BNP 1548.7 . 03/02/2021: Serum creatinine 1.95, BUN 45, Potassium 5.5, Sodium 138, BNP 1932.8   Vital Signs: . Weight: 202 lbs (last clinic weight: 203.8 lbs) . Blood pressure: 138/64 . Heart rate: 59  Assessment/Plan: 1. 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 Appears to be due to iCM. Turned down for CABG -> medical therapy - EF 40% by report - Echo 02/18/2021 EF 25-30%, RV not well visualized. - NYHA III - but improving. Volume status improved, euvolemic-dry on exam.  - Labs returned once visit was completed. Scr  increased from 1.81 to 1.95, K increased from 4.9 to 5.5. BUN increased from 39 to 45. BNP increased from 1548.7 to 1932; however, I believe the BNP increase is due to starting [facility], as his other labs and physical exam indicate he may be slightly dry. K is elevated. He eats 1 banana every day. Will have him follow low K diet (stop eating bananas) and will decrease spironolactone (see below).  - Continue compression hose -HOLD furosemide tomorrow, then decrease furosemide to 20 mg (1/2 tablet) daily. Did not decrease furosemide from 40 mg daily to 20 mg daily as instructed last week. Repeat BMET in 1 week.  - Continue metoprolol succinate 50 mg daily - Increase Entresto to 97/103 mg BID. If K and Scr have not improved on lab work next week, will consider decreasing back to 49/51 mg daily. Will continue the increased dose of Entresto for now as patient can become overwhelmed with multiple medication changes.  - Decrease spironolactone to 12.5 mg daily. Repeat BMET in 1 week.  - Return to HF Clinic on 04/01/2021 with Dr. Gala Romney.   2. HTN - BP up. Changes as above  3. CAD - apparently has 3v CAD by cath 9/21 in [place]  - Turned down for CABG -> medical therapy - No s/s angina.  - Await records - Continue aspirin, statin.   3. DM2 - hgba1c 5.9% 11/2020 - Continue Jardiance 10 mg daily  4. CKD 3b  - Scr 1.95 today, changes as above - Continue jardiance 10 mg daily - watch closely with diuresis - Referred to [facility]    Karle Plumber, PharmD, BCPS, BCCP, CPP Heart Failure Clinic Pharmacist [phone]

## 2021-02-20 NOTE — Telephone Encounter (Signed)
Sent in application via fax.  Will follow up.  

## 2021-03-02 ENCOUNTER — Ambulatory Visit (HOSPITAL_COMMUNITY)
Admission: RE | Admit: 2021-03-02 | Discharge: 2021-03-02 | Disposition: A | Discharge status: Home / Self Care | Payer: Medicare HMO | Source: Ambulatory Visit | Attending: Internal Medicine | Admitting: Internal Medicine

## 2021-03-02 ENCOUNTER — Other Ambulatory Visit: Payer: Self-pay

## 2021-03-02 VITALS — BP 138/64 | HR 59 | Wt 202.0 lb

## 2021-03-02 DIAGNOSIS — E1122 Type 2 diabetes mellitus with diabetic chronic kidney disease: Secondary | ICD-10-CM | POA: Diagnosis not present

## 2021-03-02 DIAGNOSIS — I255 Ischemic cardiomyopathy: Secondary | ICD-10-CM | POA: Diagnosis not present

## 2021-03-02 DIAGNOSIS — I5022 Chronic systolic (congestive) heart failure: Secondary | ICD-10-CM | POA: Insufficient documentation

## 2021-03-02 DIAGNOSIS — N1832 Chronic kidney disease, stage 3b: Secondary | ICD-10-CM | POA: Insufficient documentation

## 2021-03-02 DIAGNOSIS — I2510 Atherosclerotic heart disease of native coronary artery without angina pectoris: Secondary | ICD-10-CM | POA: Insufficient documentation

## 2021-03-02 DIAGNOSIS — I130 Hypertensive heart and chronic kidney disease with heart failure and stage 1 through stage 4 chronic kidney disease, or unspecified chronic kidney disease: Secondary | ICD-10-CM | POA: Insufficient documentation

## 2021-03-02 LAB — BASIC METABOLIC PANEL
Anion gap: 7 (ref 5–15)
BUN: 45 mg/dL — ABNORMAL HIGH (ref 8–23)
CO2: 25 mmol/L (ref 22–32)
Calcium: 9 mg/dL (ref 8.9–10.3)
Chloride: 106 mmol/L (ref 98–111)
Creatinine, Ser: 1.95 mg/dL — ABNORMAL HIGH (ref 0.61–1.24)
GFR, Estimated: 34 mL/min — ABNORMAL LOW (ref >60)
Glucose, Bld: 186 mg/dL — ABNORMAL HIGH (ref 70–99)
Potassium: 5.5 mmol/L — ABNORMAL HIGH (ref 3.5–5.1)
Sodium: 138 mmol/L (ref 135–145)

## 2021-03-02 LAB — BRAIN NATRIURETIC PEPTIDE: B Natriuretic Peptide: 1932.8 pg/mL — ABNORMAL HIGH (ref 0.0–100.0)

## 2021-03-02 MED ORDER — SPIRONOLACTONE 25 MG PO TABS: 12.5000 mg | ORAL_TABLET | Freq: Every day | ORAL | 2 refills | Status: AC

## 2021-03-02 MED ORDER — SACUBITRIL-VALSARTAN 97-103 MG PO TABS: 1.0000 | ORAL_TABLET | Freq: Two times a day (BID) | ORAL | 3 refills | Status: DC

## 2021-03-02 NOTE — Patient Instructions (Addendum)
It was a pleasure seeing you today!  MEDICATIONS: -We are changing your medications today -Increase Entresto to 97/103 mg (1 tablet) twice daily.  You may take 2 tablets of the 49/51 mg strength twice daily until you receive the new strength.  -Call if you have questions about your medications.  LABS: -We will call you if your labs need attention.  NEXT APPOINTMENT: Return to clinic in 1 month with Dr. Gala Romney.  In general, to take care of your heart failure: -Limit your fluid intake to 2 Liters (half-gallon) per day.   -Limit your salt intake to ideally 2-3 grams (2000-3000 mg) per day. -Weigh yourself daily and record, and bring that "weight diary" to your next appointment.  (Weight gain of 2-3 pounds in 1 day typically means fluid weight.) -The medications for your heart are to help your heart and help you live longer.   -Please contact us before stopping any of your heart medications.  Call the clinic at [phone] with questions or to reschedule future appointments.

## 2021-03-04 ENCOUNTER — Telehealth (HOSPITAL_COMMUNITY): Payer: Self-pay | Admitting: Pharmacist

## 2021-03-04 NOTE — Telephone Encounter (Signed)
Sent in Manufacturer's Assistance application to [employer] for Jardiance.   Application pending, will continue to follow.  Itamar Mcgowan, PharmD, BCPS, BCCP, CPP Heart Failure Clinic Pharmacist [phone]  

## 2021-03-09 ENCOUNTER — Telehealth (HOSPITAL_COMMUNITY): Payer: Self-pay | Admitting: Pharmacy Technician

## 2021-03-09 NOTE — Telephone Encounter (Addendum)
Advanced Heart Failure Patient Advocate Encounter   Patient was approved to receive Entresto from [employer]  Patient ID: [id number] Effective dates: 02/27/21 through 12/12/21  Advanced Heart Failure Patient Advocate Encounter   Patient was approved to receive Jardiance from [employer]  Effective dates: 03/04/21 through 12/12/21  Called and left the patient message.   Archer Asa, CPhT

## 2021-03-10 ENCOUNTER — Other Ambulatory Visit (HOSPITAL_COMMUNITY): Payer: Medicare HMO

## 2021-03-12 ENCOUNTER — Telehealth (HOSPITAL_COMMUNITY): Payer: Self-pay | Admitting: Pharmacist

## 2021-03-12 MED ORDER — SACUBITRIL-VALSARTAN 97-103 MG PO TABS: 1.0000 | ORAL_TABLET | Freq: Two times a day (BID) | ORAL | 3 refills | Status: AC

## 2021-03-12 NOTE — Telephone Encounter (Signed)
Updated Entresto script sent to [facility] [employer] patient assistance patient).  Karle Plumber, PharmD, BCPS, BCCP, CPP Heart Failure Clinic Pharmacist [phone]

## 2021-03-17 ENCOUNTER — Other Ambulatory Visit: Payer: Self-pay | Admitting: Family Medicine

## 2021-03-31 NOTE — Progress Notes (Signed)
ADVANCED HF CLINIC NOTE  Referring Physician: Dr. Wynelle Link Primary Care: Littie Deeds, MD Primary Cardiologist: New  HPI:  [name] is an 85 y/o male with HTN, DM2, HF referred by Dr. Wynelle Link for further evaluation of his HF.   At then end of 2021 moved from [place] to stay with son, Tristan Hart due to health issues.  Previously was living alone.  I saw him for the first time in 2/22.  Was diagnosed with HF in 9/21. Was in hospital for 1-2 weeks. Had a heart cath and cMRI.  EF ~40%. I do not have any records to give me the details. Says they were talking about CABG but decided he was not a good candidate and they wanted to pursue medical therapy.   Has been following with PharmD Clinic and meds being titrated.   Here with his son. Says he feels better with medicine changes. Exercises 5 days per week. Yesterday did 3.5 miles on exercise bike ver 20-30 mins + hand weights. No CP or undue dyspnea. No problems with ADLs. Walks in store without problem. Denies dizziness. No orthopnea or PND.    Past Medical History:  Diagnosis Date  . Congestive heart failure (CHF) (HCC)   . Hyperlipidemia   . Hypertension     Current Outpatient Medications  Medication Sig Dispense Refill  . atorvastatin (LIPITOR) 40 MG tablet Take 1 tablet (40 mg total) by mouth at bedtime. 90 tablet 2  . Cholecalciferol (VITAMIN D3 PO) Take 1 tablet by mouth daily in the afternoon.    . co-enzyme Q-10 30 MG capsule Take 30 mg by mouth daily.    Marland Kitchen ELDERBERRY PO Take 1 capsule by mouth.    . empagliflozin (JARDIANCE) 10 MG TABS tablet Take 1 tablet (10 mg total) by mouth daily before breakfast. 90 tablet 2  . furosemide (LASIX) 40 MG tablet Take 0.5 tablets (20 mg total) by mouth daily. 45 tablet 2  . isosorbide mononitrate (IMDUR) 30 MG 24 hr tablet Take 1 tablet (30 mg total) by mouth daily. 90 tablet 2  . metoprolol succinate (TOPROL-XL) 50 MG 24 hr tablet Take 1 tablet (50 mg total) by mouth daily. 90 tablet 2  . Multiple  Vitamin (MULTIVITAMIN) capsule Take 1 capsule by mouth daily.    . Omega-3 Fatty Acids (FISH OIL) 1000 MG CAPS Take 1 capsule by mouth 2 (two) times daily.    . Probiotic Product (PROBIOTIC-10 PO) Take 1 capsule by mouth daily.    . sacubitril-valsartan (ENTRESTO) 97-103 MG Take 1 tablet by mouth 2 (two) times daily. 180 tablet 3  . spironolactone (ALDACTONE) 25 MG tablet Take 0.5 tablets (12.5 mg total) by mouth daily. 45 tablet 2  . vitamin E 180 MG (400 UNITS) capsule Take 400 Units by mouth daily.     No current facility-administered medications for this encounter.    Not on File    Social History   Socioeconomic History  . Marital status: Single    Spouse name: Not on file  . Number of children: Not on file  . Years of education: Not on file  . Highest education level: Not on file  Occupational History  . Not on file  Tobacco Use  . Smoking status: Never Smoker  . Smokeless tobacco: Never Used  Substance and Sexual Activity  . Alcohol use: Not Currently  . Drug use: Never  . Sexual activity: Not on file  Other Topics Concern  . Not on file  Social History  Narrative  . Not on file   Social Determinants of Health   Financial Resource Strain: Not on file  Food Insecurity: Not on file  Transportation Needs: Not on file  Physical Activity: Not on file  Stress: Not on file  Social Connections: Not on file  Intimate Partner Violence: Not on file      Family History  Problem Relation Age of Onset  . Diabetes Mother   . Hypertension Mother   . Alzheimer's disease Sister   . Diabetes Brother   . Hypertension Brother   . Stroke Brother   . Cancer Brother     Vitals:   04/01/21 1019  BP: 120/70  Pulse: 65  SpO2: 96%  Weight: 93.4 kg (206 lb)    PHYSICAL EXAM: General:  Well appearing. No resp difficulty HEENT: normal Neck: supple. no JVD. Carotids 2+ bilat; no bruits. No lymphadenopathy or thryomegaly appreciated. Cor: PMI nondisplaced. Regular rate &  rhythm. No rubs, gallops or murmurs. Lungs: clear Abdomen: soft, nontender, nondistended. No hepatosplenomegaly. No bruits or masses. Good bowel sounds. Extremities: no cyanosis, clubbing, rash, edema Neuro: alert & orientedx3, cranial nerves grossly intact. moves all 4 extremities w/o difficulty. Affect pleasant  ECG: NSR 64 Anterolateral qs Personally reviewed   ASSESSMENT & PLAN:  1. 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 Appears to be due to iCM. Turned down for CABG -> medical therapy - EF 40% by report - Echo 3//22 EF 25-30%  - Much improved NYHA II - Volume status much improved as well. Now on lasix 20 daily - Continue spiro 12.5 - Continue Entresto 97/103 bid - Continue Toprol 50 daily - Continue Jardiance 10  - At last visit K was 5.5. Spiro decreased and told to stop banana intake - He uses No-salt substitute. If has potassium in it will need to stop  - Repeat labs today - We discussed ICD today and given advanced age, have decided to not proceed with it now  2. HTN - Blood pressure well controlled. Continue current regimen.  3. CAD - apparently has 3v CAD by cath 9/21 in [place]  - Turned down for CABG -> medical therapy - No s/s angina - Await records - Continue statin & ECASA  3. DM2 - hgba1c 5.9% 12/21 - Continue Jardiance  4. CKD 3b  - Scr 1.7-1.9 - Continue Jardaince - Referred to [facility]. Has appt scheduled    Arvilla Meres, MD  10:48 AM

## 2021-04-01 ENCOUNTER — Ambulatory Visit (HOSPITAL_COMMUNITY)
Admission: RE | Admit: 2021-04-01 | Discharge: 2021-04-01 | Disposition: A | Discharge status: Home / Self Care | Payer: Medicare HMO | Source: Ambulatory Visit | Attending: Internal Medicine | Admitting: Internal Medicine

## 2021-04-01 ENCOUNTER — Other Ambulatory Visit: Payer: Self-pay

## 2021-04-01 ENCOUNTER — Encounter (HOSPITAL_COMMUNITY): Payer: Self-pay | Admitting: Internal Medicine

## 2021-04-01 VITALS — BP 120/70 | HR 65 | Wt 206.0 lb

## 2021-04-01 DIAGNOSIS — Z7984 Long term (current) use of oral hypoglycemic drugs: Secondary | ICD-10-CM | POA: Insufficient documentation

## 2021-04-01 DIAGNOSIS — I2510 Atherosclerotic heart disease of native coronary artery without angina pectoris: Secondary | ICD-10-CM | POA: Diagnosis not present

## 2021-04-01 DIAGNOSIS — I5022 Chronic systolic (congestive) heart failure: Secondary | ICD-10-CM | POA: Insufficient documentation

## 2021-04-01 DIAGNOSIS — Z8249 Family history of ischemic heart disease and other diseases of the circulatory system: Secondary | ICD-10-CM | POA: Insufficient documentation

## 2021-04-01 DIAGNOSIS — Z79899 Other long term (current) drug therapy: Secondary | ICD-10-CM | POA: Insufficient documentation

## 2021-04-01 DIAGNOSIS — I130 Hypertensive heart and chronic kidney disease with heart failure and stage 1 through stage 4 chronic kidney disease, or unspecified chronic kidney disease: Secondary | ICD-10-CM | POA: Insufficient documentation

## 2021-04-01 DIAGNOSIS — N1832 Chronic kidney disease, stage 3b: Secondary | ICD-10-CM | POA: Diagnosis not present

## 2021-04-01 DIAGNOSIS — I10 Essential (primary) hypertension: Secondary | ICD-10-CM

## 2021-04-01 DIAGNOSIS — E1122 Type 2 diabetes mellitus with diabetic chronic kidney disease: Secondary | ICD-10-CM | POA: Insufficient documentation

## 2021-04-01 LAB — BASIC METABOLIC PANEL
Anion gap: 6 (ref 5–15)
BUN: 43 mg/dL — ABNORMAL HIGH (ref 8–23)
CO2: 24 mmol/L (ref 22–32)
Calcium: 9.3 mg/dL (ref 8.9–10.3)
Chloride: 108 mmol/L (ref 98–111)
Creatinine, Ser: 1.81 mg/dL — ABNORMAL HIGH (ref 0.61–1.24)
GFR, Estimated: 37 mL/min — ABNORMAL LOW (ref >60)
Glucose, Bld: 232 mg/dL — ABNORMAL HIGH (ref 70–99)
Potassium: 5 mmol/L (ref 3.5–5.1)
Sodium: 138 mmol/L (ref 135–145)

## 2021-04-01 LAB — BRAIN NATRIURETIC PEPTIDE: B Natriuretic Peptide: 4414.8 pg/mL — ABNORMAL HIGH (ref 0.0–100.0)

## 2021-04-01 NOTE — Patient Instructions (Signed)
Your physician recommends that you schedule a follow-up appointment in: 4 months  If you have any questions or concerns before your next appointment please send us a message through mychart or call our office at [phone].    TO LEAVE A MESSAGE FOR THE NURSE SELECT OPTION 2, PLEASE LEAVE A MESSAGE INCLUDING: . YOUR NAME . DATE OF BIRTH . CALL BACK NUMBER . REASON FOR CALL**this is important as we prioritize the call backs  YOU WILL RECEIVE A CALL BACK THE SAME DAY AS LONG AS YOU CALL BEFORE 4:00 PM  At the [facility], you and your health needs are our priority. As part of our continuing mission to provide you with exceptional heart care, we have created designated Provider Care Teams. These Care Teams include your primary Cardiologist (physician) and Advanced Practice Providers (APPs- Physician Assistants and Nurse Practitioners) who all work together to provide you with the care you need, when you need it.   You may see any of the following providers on your designated Care Team at your next follow up: . Dr Daniel Bensimhon . Dr Dalton McLean . Dr Brandon Winfrey . Amy Clegg, NP . Brittainy Simmons, PA . Jessica Milford,NP . Lauren Kemp, PharmD   Please be sure to bring in all your medications bottles to every appointment.     

## 2021-04-01 NOTE — Addendum Note (Signed)
Encounter addended by: Noralee Space, RN on: 04/01/2021 11:19 AM  Actions taken: Clinical Note Signed

## 2021-04-01 NOTE — Addendum Note (Signed)
Encounter addended by: Noralee Space, RN on: 04/01/2021 11:52 AM  Actions taken: Order list changed, Diagnosis association updated

## 2021-07-08 ENCOUNTER — Inpatient Hospital Stay (HOSPITAL_COMMUNITY)
Admission: EM | Admit: 2021-07-08 | Discharge: 2021-08-13 | DRG: 064 | Disposition: E | Payer: Medicare HMO | Attending: Critical Care Medicine | Admitting: Critical Care Medicine

## 2021-07-08 ENCOUNTER — Emergency Department (HOSPITAL_COMMUNITY): Payer: Medicare HMO

## 2021-07-08 DIAGNOSIS — Z781 Physical restraint status: Secondary | ICD-10-CM

## 2021-07-08 DIAGNOSIS — E669 Obesity, unspecified: Secondary | ICD-10-CM | POA: Diagnosis present

## 2021-07-08 DIAGNOSIS — Z6829 Body mass index (BMI) 29.0-29.9, adult: Secondary | ICD-10-CM

## 2021-07-08 DIAGNOSIS — R4182 Altered mental status, unspecified: Secondary | ICD-10-CM | POA: Diagnosis not present

## 2021-07-08 DIAGNOSIS — J9811 Atelectasis: Secondary | ICD-10-CM | POA: Diagnosis not present

## 2021-07-08 DIAGNOSIS — Z66 Do not resuscitate: Secondary | ICD-10-CM | POA: Diagnosis not present

## 2021-07-08 DIAGNOSIS — N179 Acute kidney failure, unspecified: Secondary | ICD-10-CM | POA: Diagnosis not present

## 2021-07-08 DIAGNOSIS — I619 Nontraumatic intracerebral hemorrhage, unspecified: Secondary | ICD-10-CM | POA: Diagnosis present

## 2021-07-08 DIAGNOSIS — I4510 Unspecified right bundle-branch block: Secondary | ICD-10-CM | POA: Diagnosis present

## 2021-07-08 DIAGNOSIS — N1832 Chronic kidney disease, stage 3b: Secondary | ICD-10-CM | POA: Diagnosis present

## 2021-07-08 DIAGNOSIS — Z28310 Unvaccinated for covid-19: Secondary | ICD-10-CM

## 2021-07-08 DIAGNOSIS — E1122 Type 2 diabetes mellitus with diabetic chronic kidney disease: Secondary | ICD-10-CM | POA: Diagnosis present

## 2021-07-08 DIAGNOSIS — Z809 Family history of malignant neoplasm, unspecified: Secondary | ICD-10-CM

## 2021-07-08 DIAGNOSIS — J1282 Pneumonia due to coronavirus disease 2019: Secondary | ICD-10-CM | POA: Diagnosis present

## 2021-07-08 DIAGNOSIS — I5022 Chronic systolic (congestive) heart failure: Secondary | ICD-10-CM | POA: Diagnosis present

## 2021-07-08 DIAGNOSIS — R410 Disorientation, unspecified: Secondary | ICD-10-CM

## 2021-07-08 DIAGNOSIS — I130 Hypertensive heart and chronic kidney disease with heart failure and stage 1 through stage 4 chronic kidney disease, or unspecified chronic kidney disease: Secondary | ICD-10-CM | POA: Diagnosis present

## 2021-07-08 DIAGNOSIS — I63522 Cerebral infarction due to unspecified occlusion or stenosis of left anterior cerebral artery: Secondary | ICD-10-CM | POA: Diagnosis present

## 2021-07-08 DIAGNOSIS — E785 Hyperlipidemia, unspecified: Secondary | ICD-10-CM | POA: Diagnosis present

## 2021-07-08 DIAGNOSIS — J690 Pneumonitis due to inhalation of food and vomit: Secondary | ICD-10-CM | POA: Diagnosis not present

## 2021-07-08 DIAGNOSIS — J9600 Acute respiratory failure, unspecified whether with hypoxia or hypercapnia: Secondary | ICD-10-CM | POA: Diagnosis present

## 2021-07-08 DIAGNOSIS — Z7984 Long term (current) use of oral hypoglycemic drugs: Secondary | ICD-10-CM

## 2021-07-08 DIAGNOSIS — Z515 Encounter for palliative care: Secondary | ICD-10-CM

## 2021-07-08 DIAGNOSIS — I651 Occlusion and stenosis of basilar artery: Secondary | ICD-10-CM | POA: Diagnosis present

## 2021-07-08 DIAGNOSIS — J189 Pneumonia, unspecified organism: Secondary | ICD-10-CM

## 2021-07-08 DIAGNOSIS — I6522 Occlusion and stenosis of left carotid artery: Secondary | ICD-10-CM | POA: Diagnosis present

## 2021-07-08 DIAGNOSIS — U071 Acute respiratory failure, unspecified whether with hypoxia or hypercapnia: Secondary | ICD-10-CM | POA: Diagnosis present

## 2021-07-08 DIAGNOSIS — L8990 Pressure ulcer of unspecified site, unspecified stage: Secondary | ICD-10-CM | POA: Insufficient documentation

## 2021-07-08 DIAGNOSIS — Z8673 Personal history of transient ischemic attack (TIA), and cerebral infarction without residual deficits: Secondary | ICD-10-CM

## 2021-07-08 DIAGNOSIS — I248 Other forms of acute ischemic heart disease: Secondary | ICD-10-CM | POA: Diagnosis not present

## 2021-07-08 DIAGNOSIS — D649 Anemia, unspecified: Secondary | ICD-10-CM

## 2021-07-08 DIAGNOSIS — Z833 Family history of diabetes mellitus: Secondary | ICD-10-CM

## 2021-07-08 DIAGNOSIS — R34 Anuria and oliguria: Secondary | ICD-10-CM | POA: Diagnosis not present

## 2021-07-08 DIAGNOSIS — I63512 Cerebral infarction due to unspecified occlusion or stenosis of left middle cerebral artery: Secondary | ICD-10-CM | POA: Diagnosis not present

## 2021-07-08 DIAGNOSIS — Y92009 Unspecified place in unspecified non-institutional (private) residence as the place of occurrence of the external cause: Secondary | ICD-10-CM

## 2021-07-08 DIAGNOSIS — G9349 Other encephalopathy: Secondary | ICD-10-CM | POA: Diagnosis present

## 2021-07-08 DIAGNOSIS — E1151 Type 2 diabetes mellitus with diabetic peripheral angiopathy without gangrene: Secondary | ICD-10-CM | POA: Diagnosis present

## 2021-07-08 DIAGNOSIS — R000 Tachycardia, unspecified: Secondary | ICD-10-CM | POA: Diagnosis not present

## 2021-07-08 DIAGNOSIS — R471 Dysarthria and anarthria: Secondary | ICD-10-CM | POA: Diagnosis present

## 2021-07-08 DIAGNOSIS — J329 Chronic sinusitis, unspecified: Secondary | ICD-10-CM | POA: Diagnosis present

## 2021-07-08 DIAGNOSIS — Z820 Family history of epilepsy and other diseases of the nervous system: Secondary | ICD-10-CM

## 2021-07-08 DIAGNOSIS — R570 Cardiogenic shock: Secondary | ICD-10-CM | POA: Diagnosis not present

## 2021-07-08 DIAGNOSIS — R29810 Facial weakness: Secondary | ICD-10-CM | POA: Diagnosis present

## 2021-07-08 DIAGNOSIS — D6489 Other specified anemias: Secondary | ICD-10-CM | POA: Diagnosis present

## 2021-07-08 DIAGNOSIS — Z4659 Encounter for fitting and adjustment of other gastrointestinal appliance and device: Secondary | ICD-10-CM

## 2021-07-08 DIAGNOSIS — I472 Ventricular tachycardia: Secondary | ICD-10-CM | POA: Diagnosis not present

## 2021-07-08 DIAGNOSIS — W010XXA Fall on same level from slipping, tripping and stumbling without subsequent striking against object, initial encounter: Secondary | ICD-10-CM | POA: Diagnosis present

## 2021-07-08 DIAGNOSIS — Z823 Family history of stroke: Secondary | ICD-10-CM

## 2021-07-08 DIAGNOSIS — R6521 Severe sepsis with septic shock: Secondary | ICD-10-CM | POA: Diagnosis not present

## 2021-07-08 DIAGNOSIS — L89151 Pressure ulcer of sacral region, stage 1: Secondary | ICD-10-CM | POA: Diagnosis present

## 2021-07-08 DIAGNOSIS — A4189 Other specified sepsis: Secondary | ICD-10-CM | POA: Diagnosis not present

## 2021-07-08 DIAGNOSIS — J9601 Acute respiratory failure with hypoxia: Secondary | ICD-10-CM | POA: Diagnosis present

## 2021-07-08 DIAGNOSIS — E1165 Type 2 diabetes mellitus with hyperglycemia: Secondary | ICD-10-CM | POA: Diagnosis present

## 2021-07-08 DIAGNOSIS — Z8249 Family history of ischemic heart disease and other diseases of the circulatory system: Secondary | ICD-10-CM

## 2021-07-08 DIAGNOSIS — R17 Unspecified jaundice: Secondary | ICD-10-CM | POA: Diagnosis not present

## 2021-07-08 LAB — COMPREHENSIVE METABOLIC PANEL
ALT: 45 U/L — ABNORMAL HIGH (ref 0–44)
AST: 33 U/L (ref 15–41)
Albumin: 2.8 g/dL — ABNORMAL LOW (ref 3.5–5.0)
Alkaline Phosphatase: 67 U/L (ref 38–126)
Anion gap: 7 (ref 5–15)
BUN: 47 mg/dL — ABNORMAL HIGH (ref 8–23)
CO2: 24 mmol/L (ref 22–32)
Calcium: 9.4 mg/dL (ref 8.9–10.3)
Chloride: 106 mmol/L (ref 98–111)
Creatinine, Ser: 2.27 mg/dL — ABNORMAL HIGH (ref 0.61–1.24)
GFR, Estimated: 28 mL/min — ABNORMAL LOW (ref >60)
Glucose, Bld: 258 mg/dL — ABNORMAL HIGH (ref 70–99)
Potassium: 4.1 mmol/L (ref 3.5–5.1)
Sodium: 137 mmol/L (ref 135–145)
Total Bilirubin: 0.4 mg/dL (ref 0.3–1.2)
Total Protein: 5.9 g/dL — ABNORMAL LOW (ref 6.5–8.1)

## 2021-07-08 LAB — CBC WITH DIFFERENTIAL/PLATELET
Abs Immature Granulocytes: 0.03 10*3/uL (ref 0.00–0.07)
Basophils Absolute: 0 10*3/uL (ref 0.0–0.1)
Basophils Relative: 1 %
Eosinophils Absolute: 0.2 10*3/uL (ref 0.0–0.5)
Eosinophils Relative: 4 %
HCT: 35.1 % — ABNORMAL LOW (ref 39.0–52.0)
Hemoglobin: 11.8 g/dL — ABNORMAL LOW (ref 13.0–17.0)
Immature Granulocytes: 1 %
Lymphocytes Relative: 10 %
Lymphs Abs: 0.6 10*3/uL — ABNORMAL LOW (ref 0.7–4.0)
MCH: 33.6 pg (ref 26.0–34.0)
MCHC: 33.6 g/dL (ref 30.0–36.0)
MCV: 100 fL (ref 80.0–100.0)
Monocytes Absolute: 0.6 10*3/uL (ref 0.1–1.0)
Monocytes Relative: 10 %
Neutro Abs: 4.6 10*3/uL (ref 1.7–7.7)
Neutrophils Relative %: 74 %
Platelets: 319 10*3/uL (ref 150–400)
RBC: 3.51 MIL/uL — ABNORMAL LOW (ref 4.22–5.81)
RDW: 14.1 % (ref 11.5–15.5)
WBC: 6 10*3/uL (ref 4.0–10.5)
nRBC: 0 % (ref 0.0–0.2)

## 2021-07-08 LAB — LACTIC ACID, PLASMA: Lactic Acid, Venous: 1.4 mmol/L (ref 0.5–1.9)

## 2021-07-08 IMAGING — DX DG CHEST 2V
3 series · 3 of 3 positions shown · non-contrast
Comparison: None.

CLINICAL DATA: COVID positive.  Confusion.

EXAM:
CHEST - 2 VIEW

[chest pa]
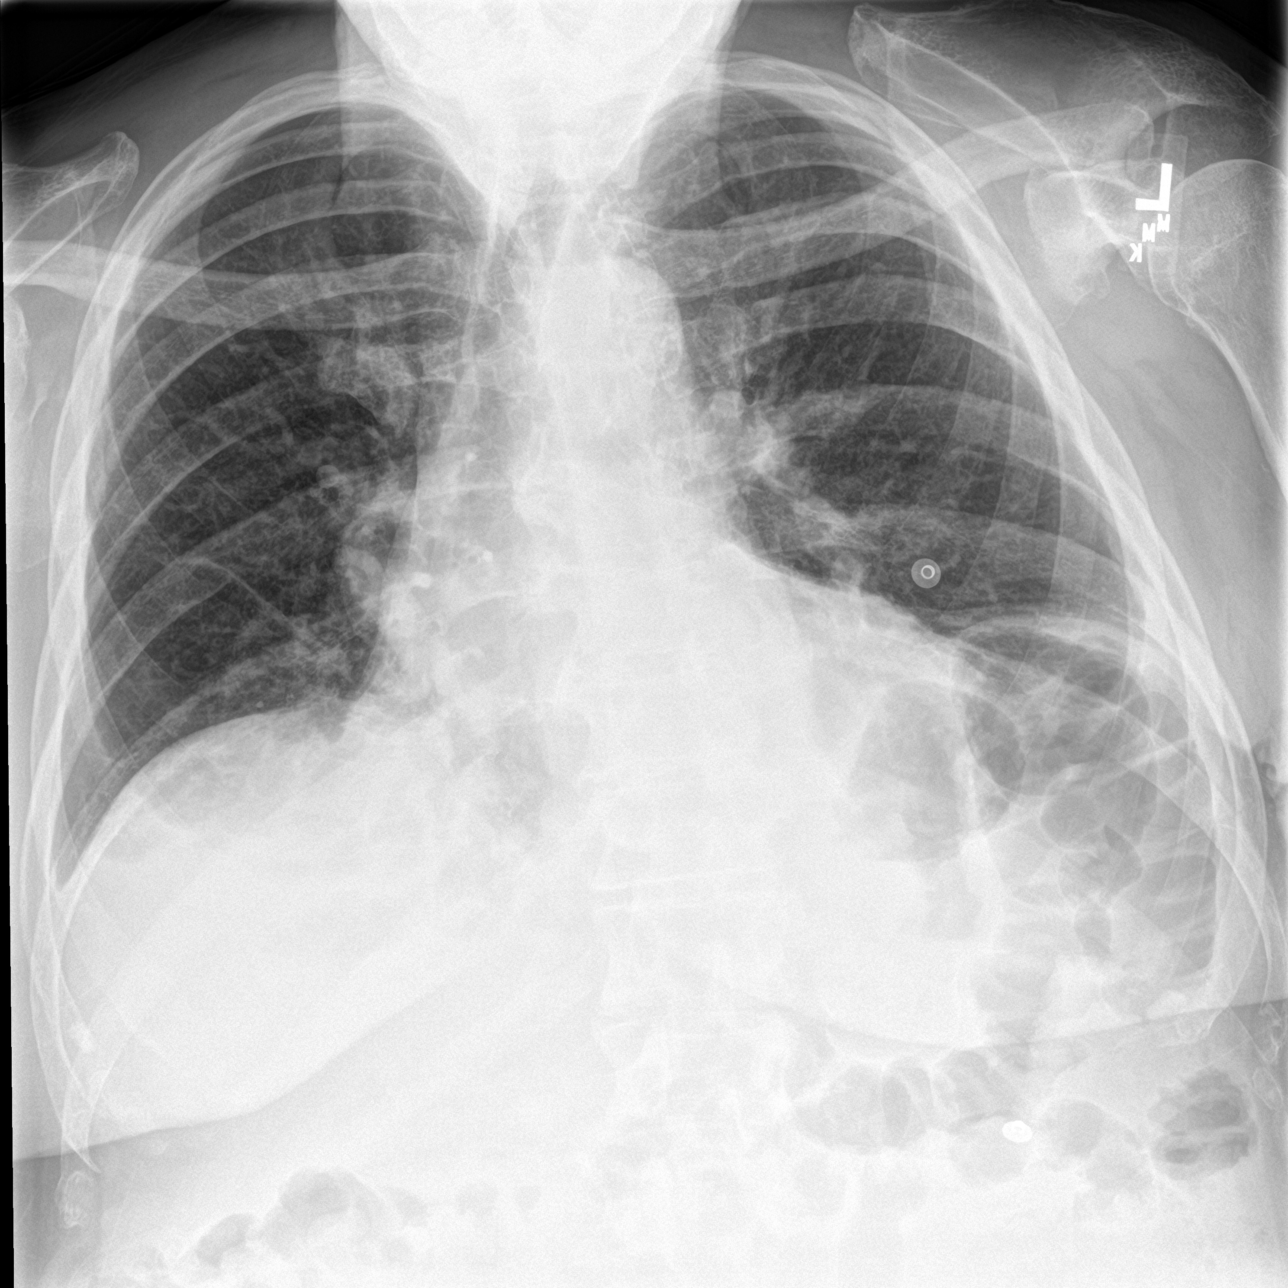

[chest lat (1 of 2)]
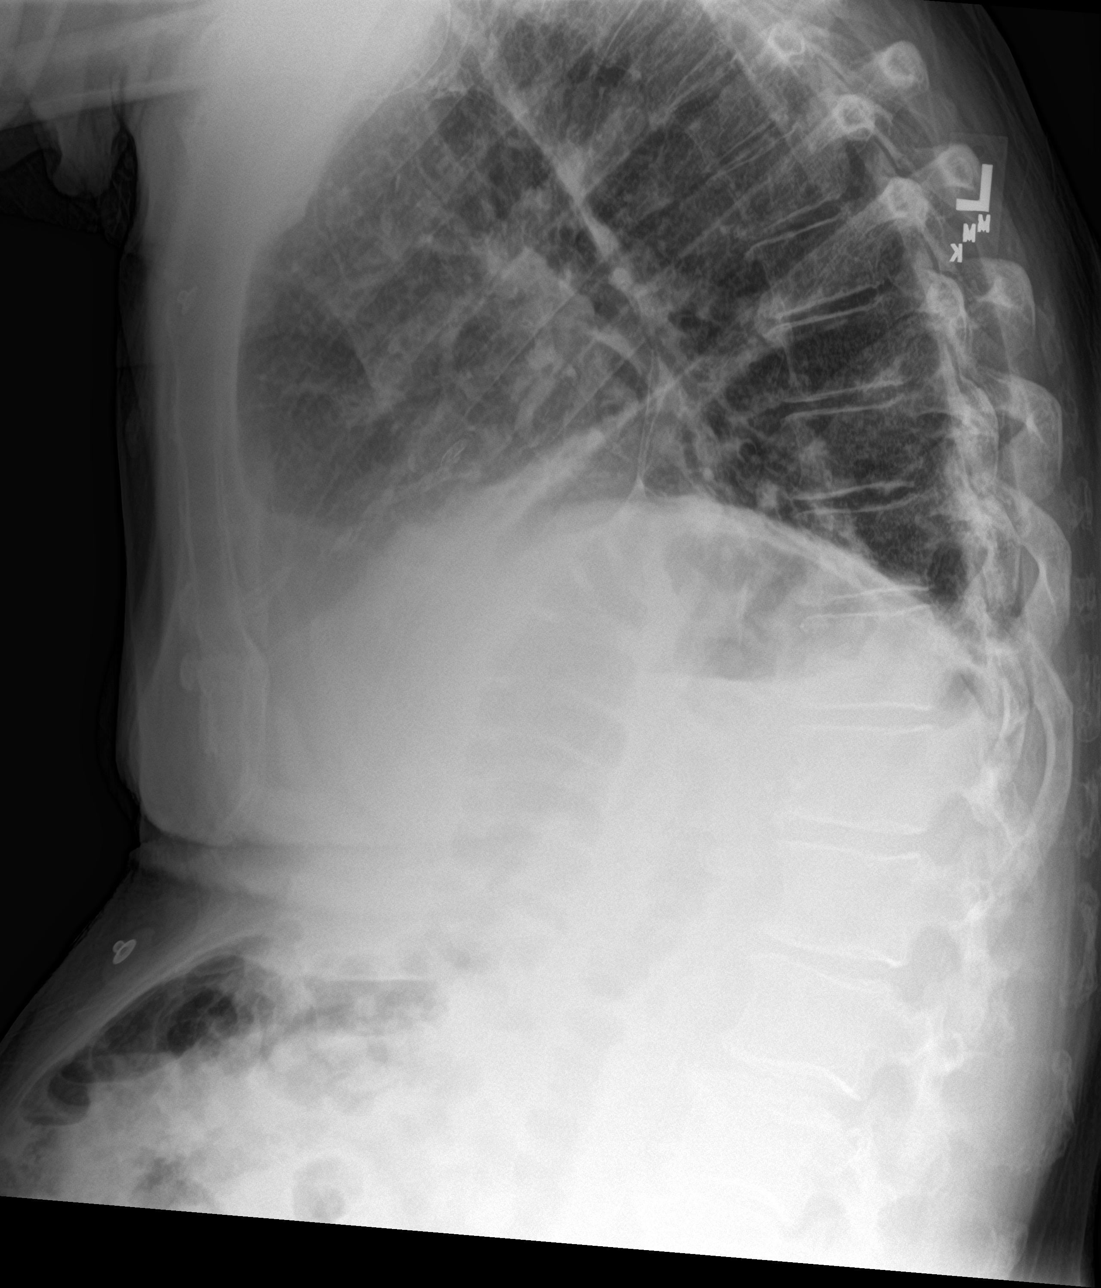

[chest lat (2 of 2)]
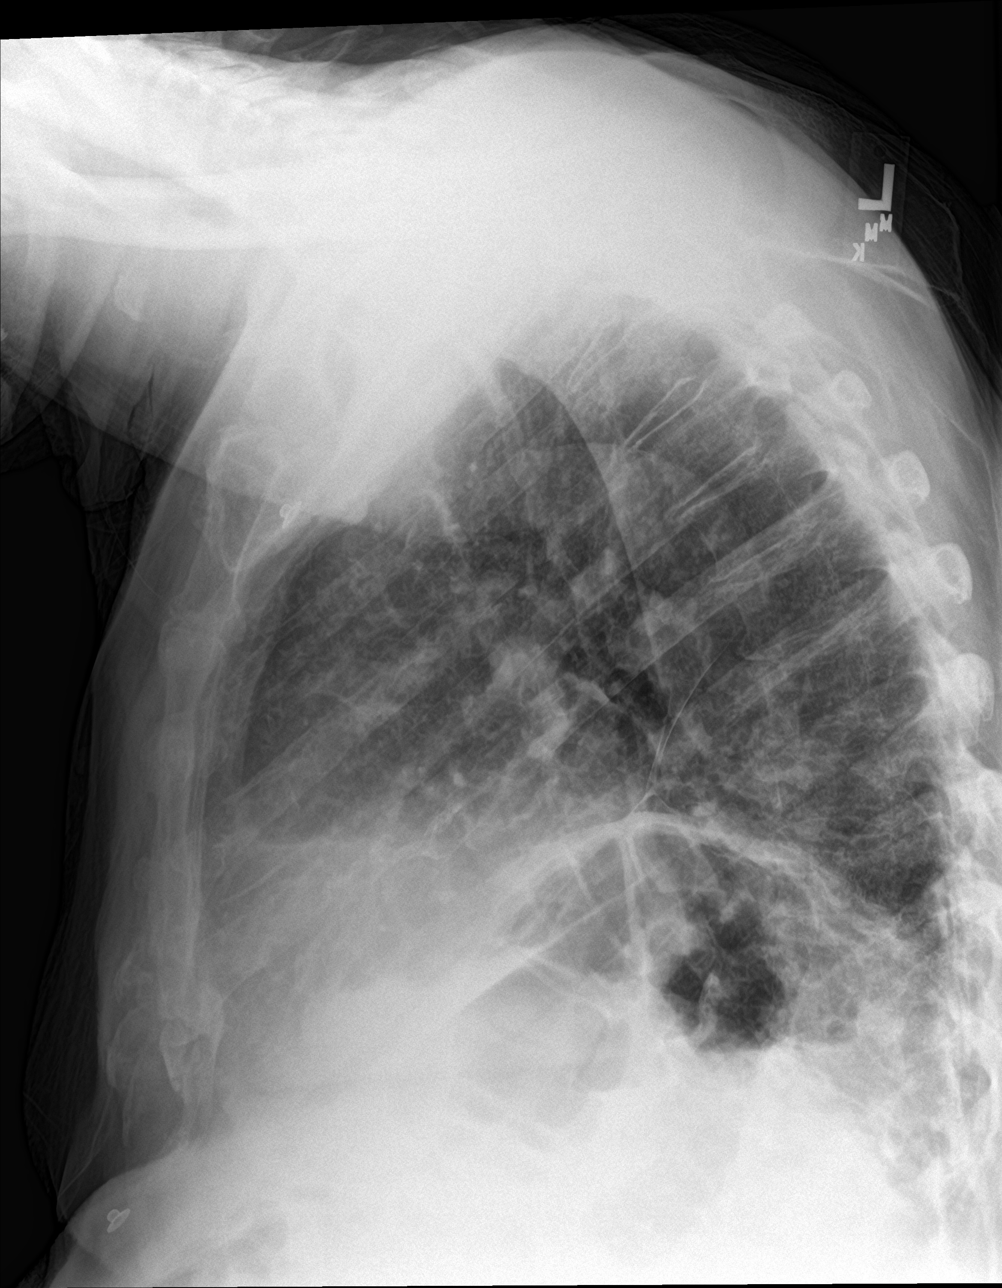

[3 of 3 positions shown; findings below may reference images not displayed]

FINDINGS: Low lung volumes. Heart size grossly normal. Question of
retrocardiac hiatal hernia, not well assessed on the current exam.
Coronary artery calcification or stent is seen. There are streaky
bibasilar opacities. No pulmonary edema. No pleural effusion. No
pneumothorax. Anti lordotic positioning without acute osseous
abnormalities.
IMPRESSION: Low lung volumes with streaky bibasilar opacities, likely
atelectasis, however pneumonia cannot be excluded in the setting of
[UL].

## 2021-07-08 NOTE — ED Provider Notes (Addendum)
MSE was initiated and I personally evaluated the patient and placed orders (if any) at  10:16 PM on July 24, 2021.  Patient to ED with report of COVID+, confused. Arrived with family but family left and is unavailable by phone - both numbers in patient's chart attempted.  11:45 - Per family (christopher son), COVID 8 days ago, better. Over last 2 days, increased confusion, speech slurred, walking off balance. No h/o CVA. Family concerned for confusion secondary to COVID vs CVA.  The patient is awake and alert, speech is slurred, unable to obtain information.   Today's Vitals   July 24, 2021 2135  BP: (!) 108/59  Pulse: 65  Resp: 20  Temp: 98.2 F (36.8 C)  TempSrc: Oral  SpO2: 93%   There is no height or weight on file to calculate BMI.  Awake, alert Speech is confused No dyspnea RRR  11:45 - patient attempted to stand and walk in the waiting room and fell, hitting his head on the floor. No LOC. Assisted back to chair. Family who was waiting outside, will come in to sit with him during waiting period.   The patient appears stable so that the remainder of the MSE may be completed by another provider.   Elpidio Anis, PA-C 07/24/21 2219    Elpidio Anis, PA-C 07-24-21 2349    Maia Plan, MD 07/13/21 1254

## 2021-07-08 NOTE — ED Triage Notes (Signed)
Pt checked in by family at front desk with c/o "covid and confused". Pt is only oriented to person and place. He is unable to tell me why he is here, unsure of covid testing. Tried to call family listed in chart, unable to reach them. He denies pain. No acute distress.

## 2021-07-09 ENCOUNTER — Emergency Department (HOSPITAL_COMMUNITY): Payer: Medicare HMO

## 2021-07-09 ENCOUNTER — Observation Stay (HOSPITAL_COMMUNITY): Payer: Medicare HMO

## 2021-07-09 ENCOUNTER — Inpatient Hospital Stay (HOSPITAL_COMMUNITY): Payer: Medicare HMO

## 2021-07-09 DIAGNOSIS — A4189 Other specified sepsis: Secondary | ICD-10-CM | POA: Diagnosis not present

## 2021-07-09 DIAGNOSIS — R6521 Severe sepsis with septic shock: Secondary | ICD-10-CM | POA: Diagnosis not present

## 2021-07-09 DIAGNOSIS — Y92009 Unspecified place in unspecified non-institutional (private) residence as the place of occurrence of the external cause: Secondary | ICD-10-CM | POA: Diagnosis not present

## 2021-07-09 DIAGNOSIS — I5022 Chronic systolic (congestive) heart failure: Secondary | ICD-10-CM | POA: Diagnosis present

## 2021-07-09 DIAGNOSIS — R17 Unspecified jaundice: Secondary | ICD-10-CM | POA: Diagnosis not present

## 2021-07-09 DIAGNOSIS — I639 Cerebral infarction, unspecified: Secondary | ICD-10-CM | POA: Diagnosis not present

## 2021-07-09 DIAGNOSIS — I63232 Cerebral infarction due to unspecified occlusion or stenosis of left carotid arteries: Secondary | ICD-10-CM | POA: Diagnosis not present

## 2021-07-09 DIAGNOSIS — K6389 Other specified diseases of intestine: Secondary | ICD-10-CM | POA: Diagnosis not present

## 2021-07-09 DIAGNOSIS — I63212 Cerebral infarction due to unspecified occlusion or stenosis of left vertebral arteries: Secondary | ICD-10-CM | POA: Diagnosis not present

## 2021-07-09 DIAGNOSIS — N1832 Chronic kidney disease, stage 3b: Secondary | ICD-10-CM | POA: Diagnosis present

## 2021-07-09 DIAGNOSIS — J811 Chronic pulmonary edema: Secondary | ICD-10-CM | POA: Diagnosis not present

## 2021-07-09 DIAGNOSIS — I5020 Unspecified systolic (congestive) heart failure: Secondary | ICD-10-CM | POA: Diagnosis not present

## 2021-07-09 DIAGNOSIS — I130 Hypertensive heart and chronic kidney disease with heart failure and stage 1 through stage 4 chronic kidney disease, or unspecified chronic kidney disease: Secondary | ICD-10-CM | POA: Diagnosis present

## 2021-07-09 DIAGNOSIS — J9600 Acute respiratory failure, unspecified whether with hypoxia or hypercapnia: Secondary | ICD-10-CM | POA: Diagnosis not present

## 2021-07-09 DIAGNOSIS — I619 Nontraumatic intracerebral hemorrhage, unspecified: Secondary | ICD-10-CM | POA: Diagnosis not present

## 2021-07-09 DIAGNOSIS — I472 Ventricular tachycardia: Secondary | ICD-10-CM | POA: Diagnosis not present

## 2021-07-09 DIAGNOSIS — R579 Shock, unspecified: Secondary | ICD-10-CM | POA: Diagnosis not present

## 2021-07-09 DIAGNOSIS — I63522 Cerebral infarction due to unspecified occlusion or stenosis of left anterior cerebral artery: Secondary | ICD-10-CM | POA: Diagnosis not present

## 2021-07-09 DIAGNOSIS — R000 Tachycardia, unspecified: Secondary | ICD-10-CM | POA: Diagnosis not present

## 2021-07-09 DIAGNOSIS — R4182 Altered mental status, unspecified: Secondary | ICD-10-CM | POA: Diagnosis not present

## 2021-07-09 DIAGNOSIS — E669 Obesity, unspecified: Secondary | ICD-10-CM | POA: Diagnosis present

## 2021-07-09 DIAGNOSIS — I63512 Cerebral infarction due to unspecified occlusion or stenosis of left middle cerebral artery: Secondary | ICD-10-CM | POA: Diagnosis not present

## 2021-07-09 DIAGNOSIS — I651 Occlusion and stenosis of basilar artery: Secondary | ICD-10-CM | POA: Diagnosis present

## 2021-07-09 DIAGNOSIS — J9811 Atelectasis: Secondary | ICD-10-CM | POA: Diagnosis not present

## 2021-07-09 DIAGNOSIS — E1122 Type 2 diabetes mellitus with diabetic chronic kidney disease: Secondary | ICD-10-CM | POA: Diagnosis present

## 2021-07-09 DIAGNOSIS — E1165 Type 2 diabetes mellitus with hyperglycemia: Secondary | ICD-10-CM | POA: Diagnosis present

## 2021-07-09 DIAGNOSIS — W010XXA Fall on same level from slipping, tripping and stumbling without subsequent striking against object, initial encounter: Secondary | ICD-10-CM | POA: Diagnosis not present

## 2021-07-09 DIAGNOSIS — Z66 Do not resuscitate: Secondary | ICD-10-CM | POA: Diagnosis not present

## 2021-07-09 DIAGNOSIS — G463 Brain stem stroke syndrome: Secondary | ICD-10-CM | POA: Diagnosis not present

## 2021-07-09 DIAGNOSIS — R410 Disorientation, unspecified: Secondary | ICD-10-CM

## 2021-07-09 DIAGNOSIS — D649 Anemia, unspecified: Secondary | ICD-10-CM | POA: Diagnosis not present

## 2021-07-09 DIAGNOSIS — J90 Pleural effusion, not elsewhere classified: Secondary | ICD-10-CM | POA: Diagnosis not present

## 2021-07-09 DIAGNOSIS — Z515 Encounter for palliative care: Secondary | ICD-10-CM | POA: Diagnosis not present

## 2021-07-09 DIAGNOSIS — J9601 Acute respiratory failure with hypoxia: Secondary | ICD-10-CM | POA: Diagnosis not present

## 2021-07-09 DIAGNOSIS — I6522 Occlusion and stenosis of left carotid artery: Secondary | ICD-10-CM | POA: Diagnosis not present

## 2021-07-09 DIAGNOSIS — Z4682 Encounter for fitting and adjustment of non-vascular catheter: Secondary | ICD-10-CM | POA: Diagnosis not present

## 2021-07-09 DIAGNOSIS — I63233 Cerebral infarction due to unspecified occlusion or stenosis of bilateral carotid arteries: Secondary | ICD-10-CM | POA: Diagnosis not present

## 2021-07-09 DIAGNOSIS — J189 Pneumonia, unspecified organism: Secondary | ICD-10-CM | POA: Diagnosis not present

## 2021-07-09 DIAGNOSIS — U071 COVID-19: Secondary | ICD-10-CM | POA: Diagnosis not present

## 2021-07-09 DIAGNOSIS — Z9911 Dependence on respirator [ventilator] status: Secondary | ICD-10-CM | POA: Diagnosis not present

## 2021-07-09 DIAGNOSIS — E1151 Type 2 diabetes mellitus with diabetic peripheral angiopathy without gangrene: Secondary | ICD-10-CM | POA: Diagnosis present

## 2021-07-09 DIAGNOSIS — J1282 Pneumonia due to coronavirus disease 2019: Secondary | ICD-10-CM | POA: Diagnosis not present

## 2021-07-09 DIAGNOSIS — J690 Pneumonitis due to inhalation of food and vomit: Secondary | ICD-10-CM | POA: Diagnosis not present

## 2021-07-09 DIAGNOSIS — N179 Acute kidney failure, unspecified: Secondary | ICD-10-CM

## 2021-07-09 DIAGNOSIS — G9349 Other encephalopathy: Secondary | ICD-10-CM | POA: Diagnosis present

## 2021-07-09 LAB — COMPREHENSIVE METABOLIC PANEL
ALT: 47 U/L — ABNORMAL HIGH (ref 0–44)
AST: 34 U/L (ref 15–41)
Albumin: 3.2 g/dL — ABNORMAL LOW (ref 3.5–5.0)
Alkaline Phosphatase: 81 U/L (ref 38–126)
Anion gap: 10 (ref 5–15)
BUN: 42 mg/dL — ABNORMAL HIGH (ref 8–23)
CO2: 23 mmol/L (ref 22–32)
Calcium: 9.7 mg/dL (ref 8.9–10.3)
Chloride: 110 mmol/L (ref 98–111)
Creatinine, Ser: 1.96 mg/dL — ABNORMAL HIGH (ref 0.61–1.24)
GFR, Estimated: 33 mL/min — ABNORMAL LOW (ref >60)
Glucose, Bld: 226 mg/dL — ABNORMAL HIGH (ref 70–99)
Potassium: 4.2 mmol/L (ref 3.5–5.1)
Sodium: 143 mmol/L (ref 135–145)
Total Bilirubin: 1.3 mg/dL — ABNORMAL HIGH (ref 0.3–1.2)
Total Protein: 6.8 g/dL (ref 6.5–8.1)

## 2021-07-09 LAB — URINALYSIS, ROUTINE W REFLEX MICROSCOPIC
Bacteria, UA: NONE SEEN
Bilirubin Urine: NEGATIVE
Glucose, UA: >=500 mg/dL — AB
Hgb urine dipstick: NEGATIVE
Ketones, ur: NEGATIVE mg/dL
Nitrite: NEGATIVE
Protein, ur: 100 mg/dL — AB
Specific Gravity, Urine: 1.018 (ref 1.005–1.030)
pH: 5 (ref 5.0–8.0)

## 2021-07-09 LAB — APTT: aPTT: 35 seconds (ref 24–36)

## 2021-07-09 LAB — I-STAT VENOUS BLOOD GAS, ED
Acid-base deficit: 3 mmol/L — ABNORMAL HIGH (ref 0.0–2.0)
Bicarbonate: 25.8 mmol/L (ref 20.0–28.0)
Calcium, Ion: 1.31 mmol/L (ref 1.15–1.40)
HCT: 43 % (ref 39.0–52.0)
Hemoglobin: 14.6 g/dL (ref 13.0–17.0)
O2 Saturation: 91 %
Potassium: 4.2 mmol/L (ref 3.5–5.1)
Sodium: 144 mmol/L (ref 135–145)
TCO2: 28 mmol/L (ref 22–32)
pCO2, Ven: 61.5 mmHg — ABNORMAL HIGH (ref 44.0–60.0)
pH, Ven: 7.232 — ABNORMAL LOW (ref 7.250–7.430)
pO2, Ven: 75 mmHg — ABNORMAL HIGH (ref 32.0–45.0)

## 2021-07-09 LAB — I-STAT ARTERIAL BLOOD GAS, ED
Acid-base deficit: 3 mmol/L — ABNORMAL HIGH (ref 0.0–2.0)
Bicarbonate: 23.3 mmol/L (ref 20.0–28.0)
Calcium, Ion: 1.36 mmol/L (ref 1.15–1.40)
HCT: 40 % (ref 39.0–52.0)
Hemoglobin: 13.6 g/dL (ref 13.0–17.0)
O2 Saturation: 100 %
Potassium: 3.8 mmol/L (ref 3.5–5.1)
Sodium: 145 mmol/L (ref 135–145)
TCO2: 25 mmol/L (ref 22–32)
pCO2 arterial: 46.2 mmHg (ref 32.0–48.0)
pH, Arterial: 7.311 — ABNORMAL LOW (ref 7.350–7.450)
pO2, Arterial: 200 mmHg — ABNORMAL HIGH (ref 83.0–108.0)

## 2021-07-09 LAB — PROTIME-INR
INR: 1.2 (ref 0.8–1.2)
Prothrombin Time: 15 seconds (ref 11.4–15.2)

## 2021-07-09 LAB — CBC
HCT: 44.8 % (ref 39.0–52.0)
Hemoglobin: 14.2 g/dL (ref 13.0–17.0)
MCH: 31.3 pg (ref 26.0–34.0)
MCHC: 31.7 g/dL (ref 30.0–36.0)
MCV: 98.9 fL (ref 80.0–100.0)
Platelets: 417 10*3/uL — ABNORMAL HIGH (ref 150–400)
RBC: 4.53 MIL/uL (ref 4.22–5.81)
RDW: 13.3 % (ref 11.5–15.5)
WBC: 11.2 10*3/uL — ABNORMAL HIGH (ref 4.0–10.5)
nRBC: 0 % (ref 0.0–0.2)

## 2021-07-09 LAB — RESP PANEL BY RT-PCR (FLU A&B, COVID) ARPGX2
Influenza A by PCR: NEGATIVE
Influenza B by PCR: NEGATIVE
SARS Coronavirus 2 by RT PCR: POSITIVE — AB

## 2021-07-09 LAB — LACTIC ACID, PLASMA: Lactic Acid, Venous: 0.8 mmol/L (ref 0.5–1.9)

## 2021-07-09 LAB — GLUCOSE, CAPILLARY: Glucose-Capillary: 143 mg/dL — ABNORMAL HIGH (ref 70–99)

## 2021-07-09 LAB — TROPONIN I (HIGH SENSITIVITY)
Troponin I (High Sensitivity): 96 ng/L — ABNORMAL HIGH (ref <18)
Troponin I (High Sensitivity): 164 ng/L (ref <18)

## 2021-07-09 LAB — CBG MONITORING, ED: Glucose-Capillary: 247 mg/dL — ABNORMAL HIGH (ref 70–99)

## 2021-07-09 LAB — BRAIN NATRIURETIC PEPTIDE: B Natriuretic Peptide: 2043.9 pg/mL — ABNORMAL HIGH (ref 0.0–100.0)

## 2021-07-09 IMAGING — DX DG CHEST 1V PORT
1 series · 1 of 1 positions shown · non-contrast
Comparison: Chest x-ray dated [DATE]

CLINICAL DATA: Post intubation

EXAM:
PORTABLE CHEST 1 VIEW

[chest ap]
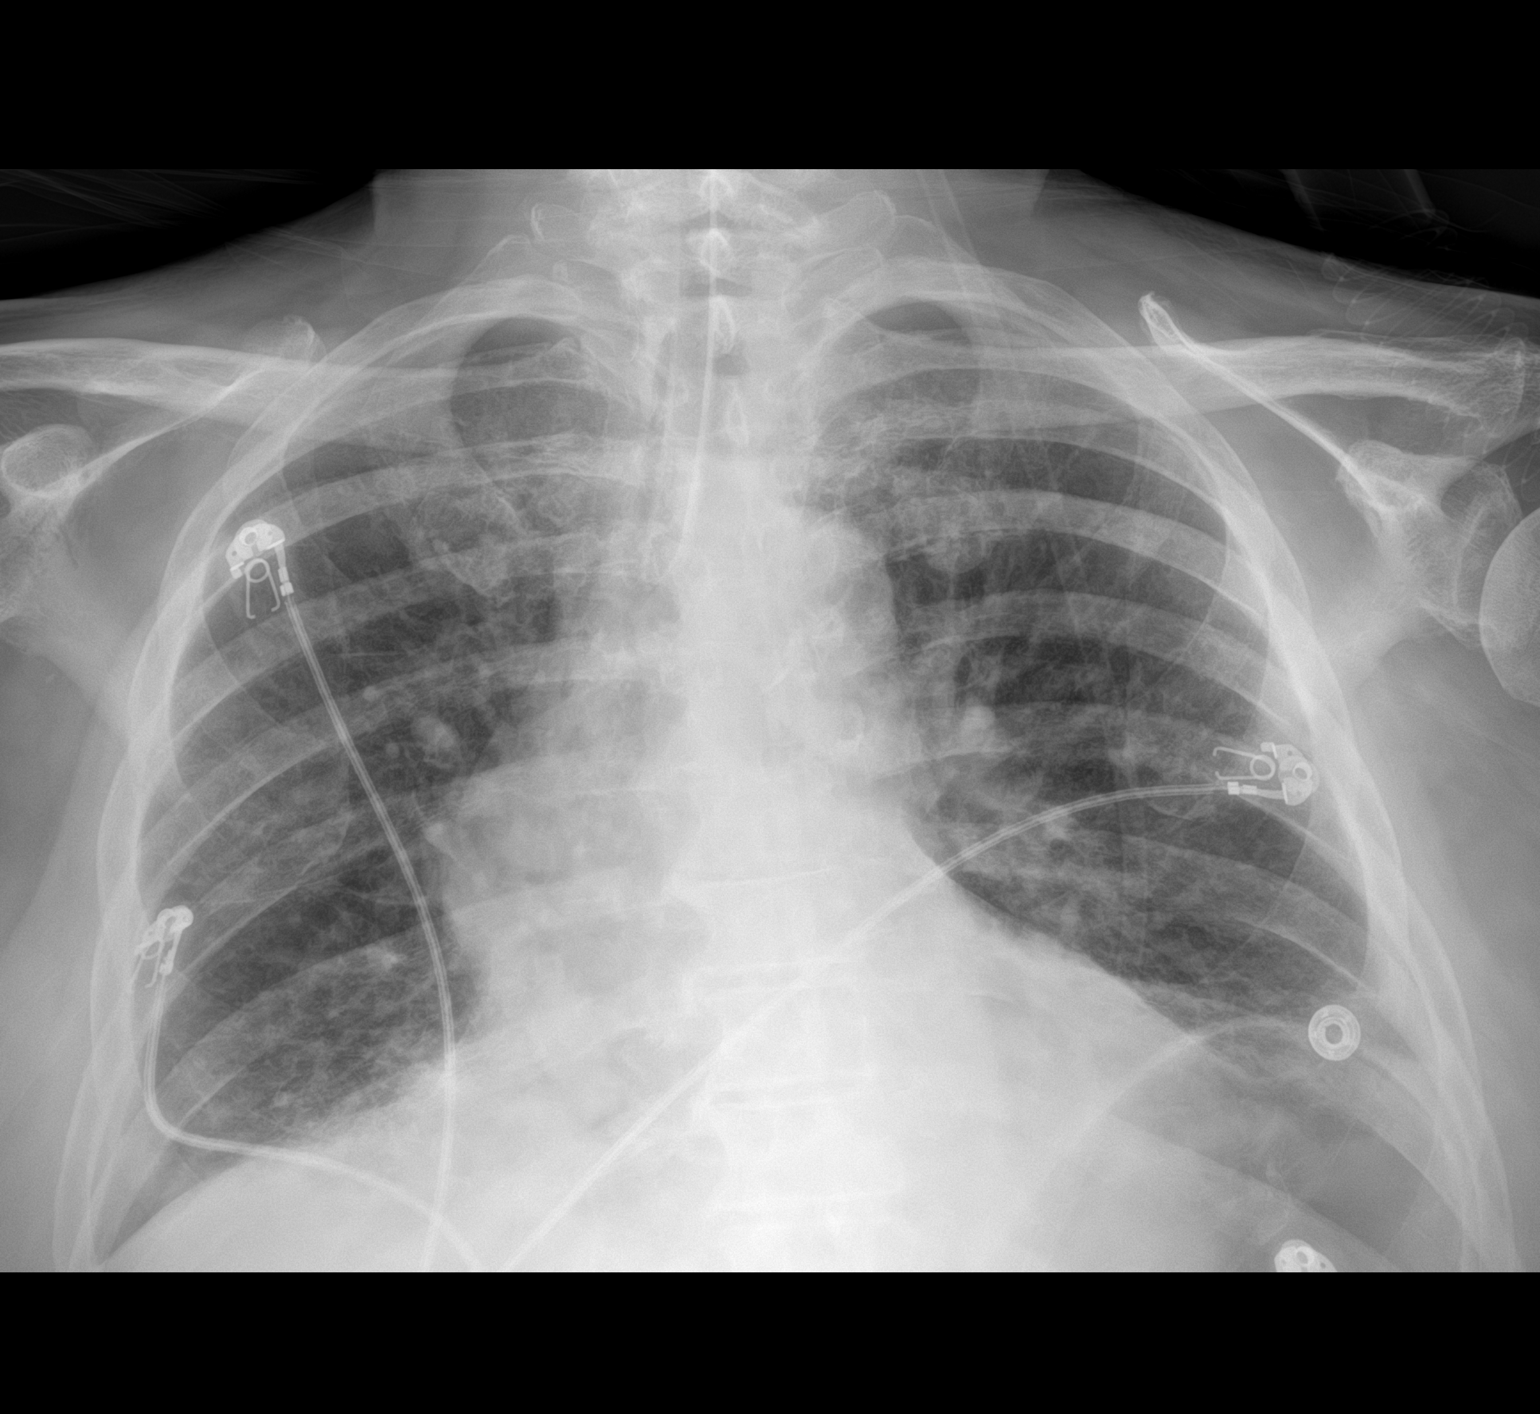

[1 of 1 positions shown; findings below may reference images not displayed]

FINDINGS: Interval intubation with ET tube tip positioned approximately 3 cm
from the carina.

Cardiac and mediastinal contours are unchanged.

Mild scattered linear opacities, likely atelectasis. No large
pleural effusion or pneumothorax.
IMPRESSION: Interval intubation with ET tube tip positioned approximately 3 cm
from the carina.

## 2021-07-09 IMAGING — CT CT HEAD W/O CM
4 series · 17 of 47 positions shown, 19 images · non-contrast
Comparison: None.

CLINICAL DATA: Altered mental status.

EXAM:
CT HEAD WITHOUT CONTRAST
TECHNIQUE: Contiguous axial images were obtained from the base of the skull
through the vertex without intravenous contrast.

[Series 3: head without · axial · non-contrast · 0.45mm/px · z∈[-221,-96]mm · 7 of 35 slices shown, 9 images]
[im 5/35  brain]
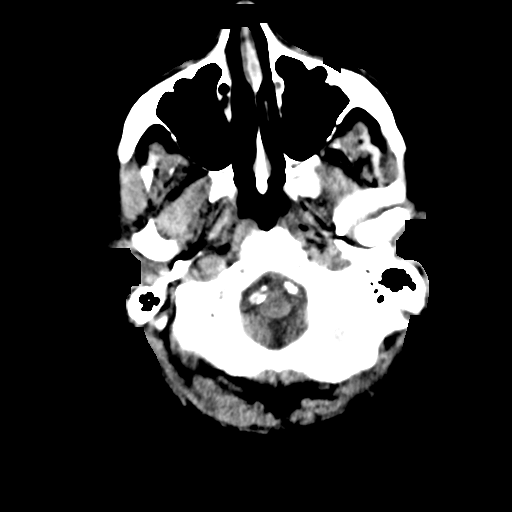
[im 5/35  bone]
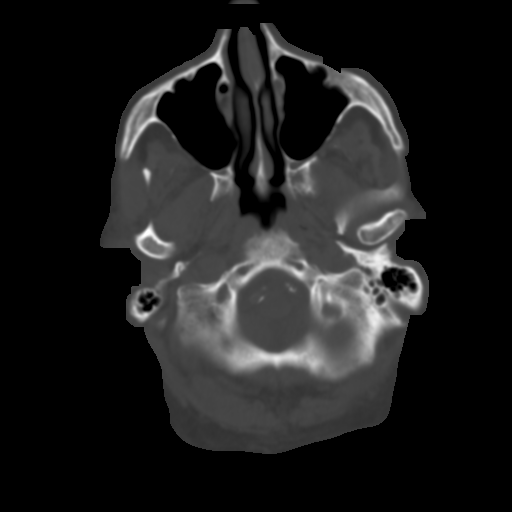
[im 9/35  brain]
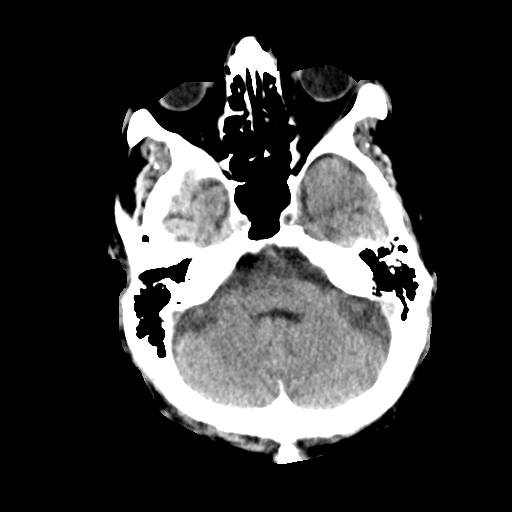
[im 13/35  brain]
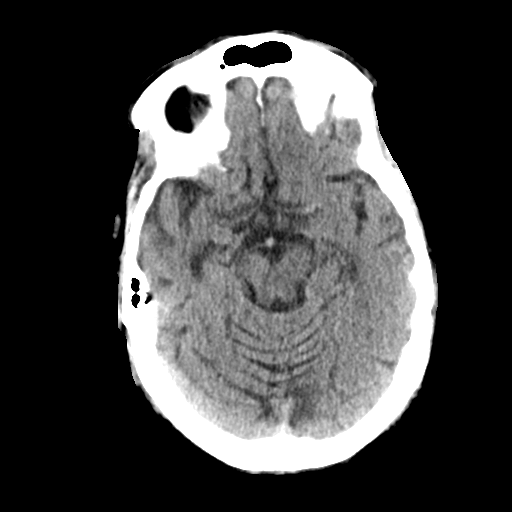
[im 18/35  brain]
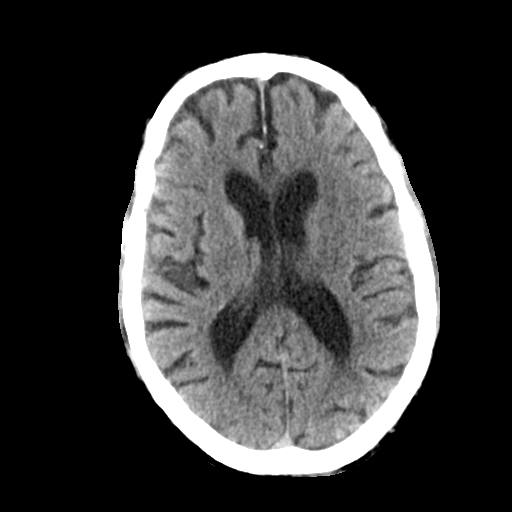
[im 22/35  brain]
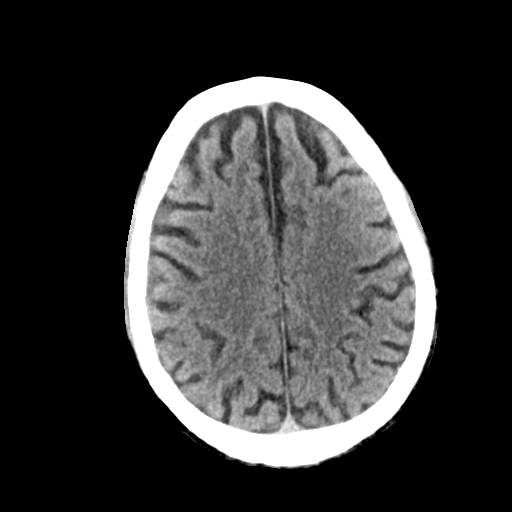
[im 22/35  bone]
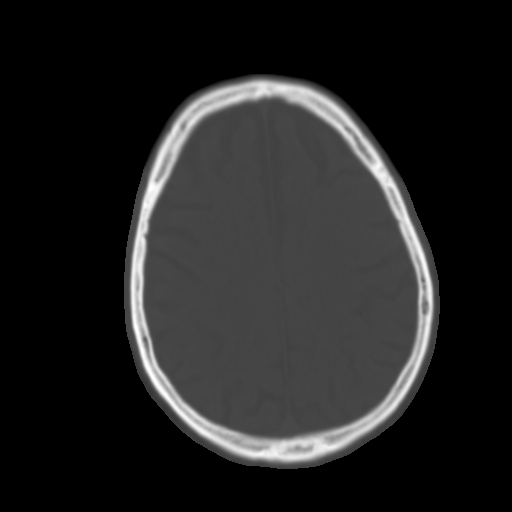
[im 26/35  brain]
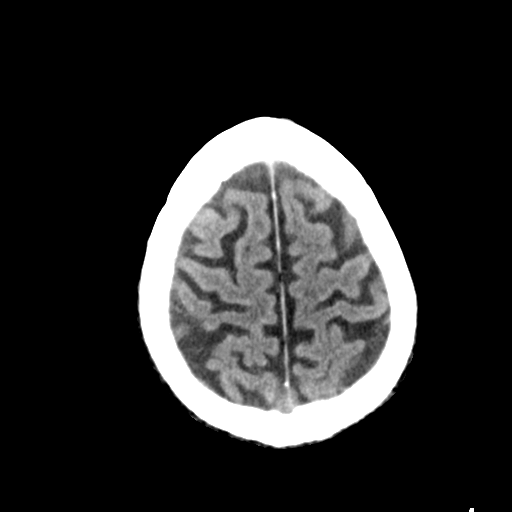
[im 30/35  brain]
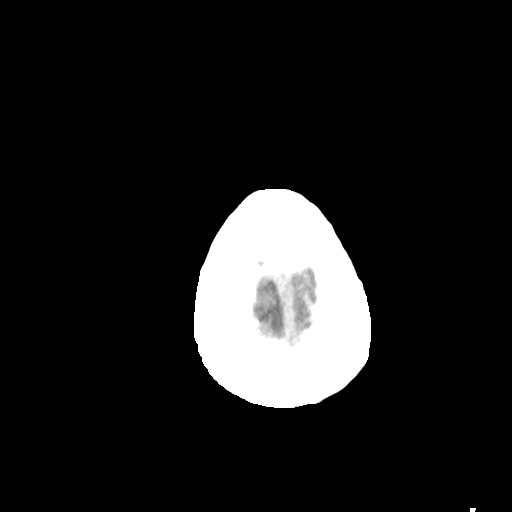

[Series 4: head bone · axial · 0.45mm/px · z∈[-225,-165]mm · 4 of 87 slices shown]
[im 9/87  bone]
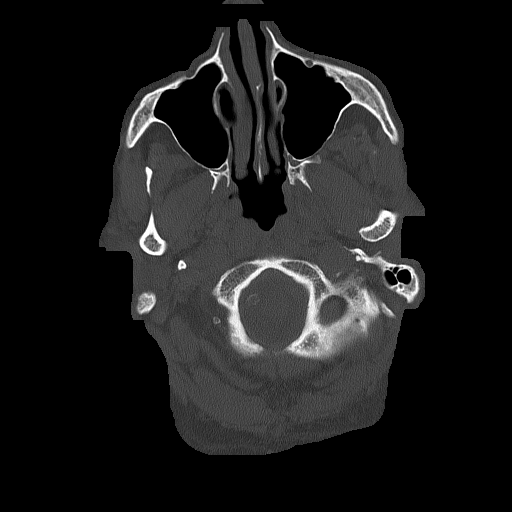
[im 18/87  bone]
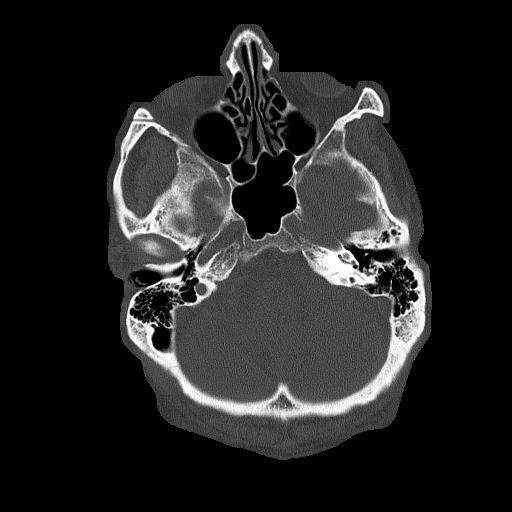
[im 26/87  bone]
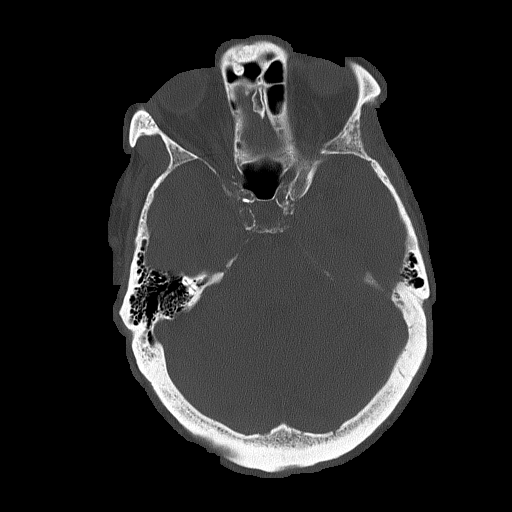
[im 39/87  bone]
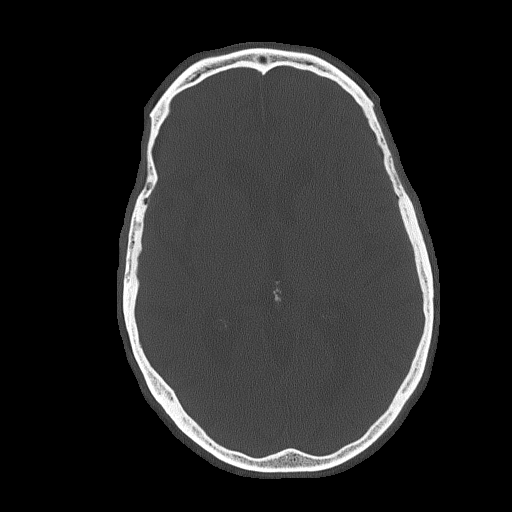

[Series 5: head without cor · coronal · non-contrast · 0.34mm/px · 3 of 71 slices shown]
[im 24/71  brain]
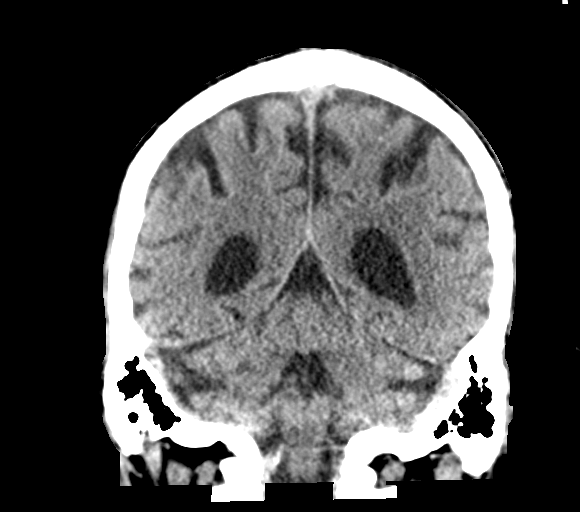
[im 32/71  brain]
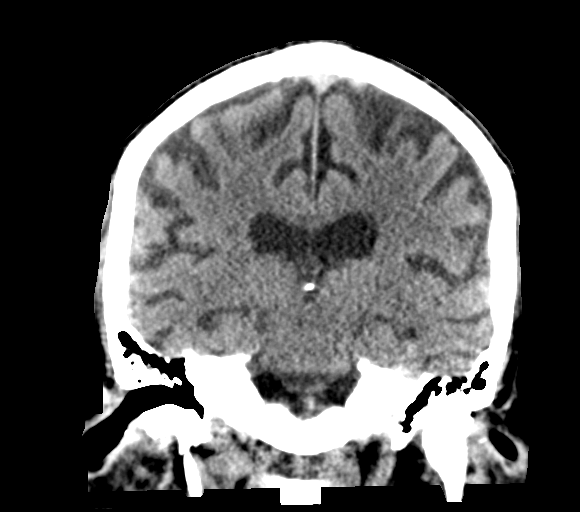
[im 39/71  brain]
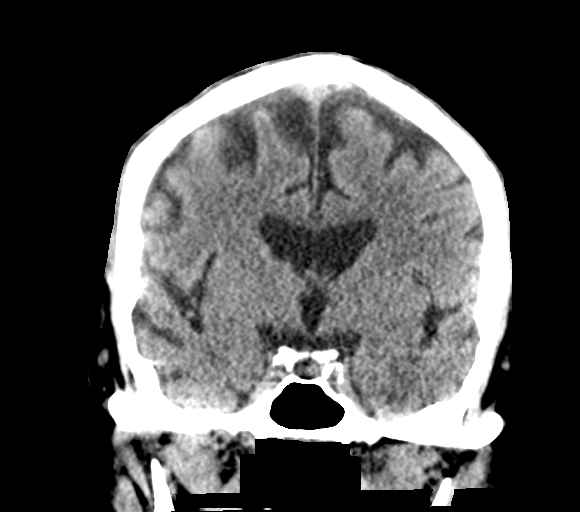

[Series 6: head without sag · sagittal · non-contrast · 0.34mm/px · 3 of 60 slices shown]
[im 20/60  brain]
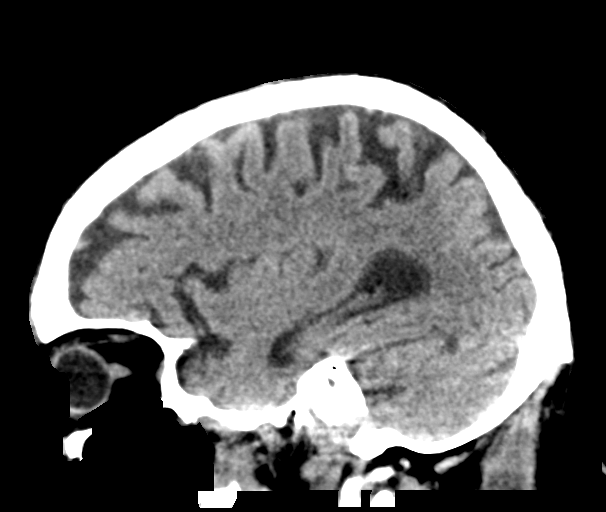
[im 30/60  brain]
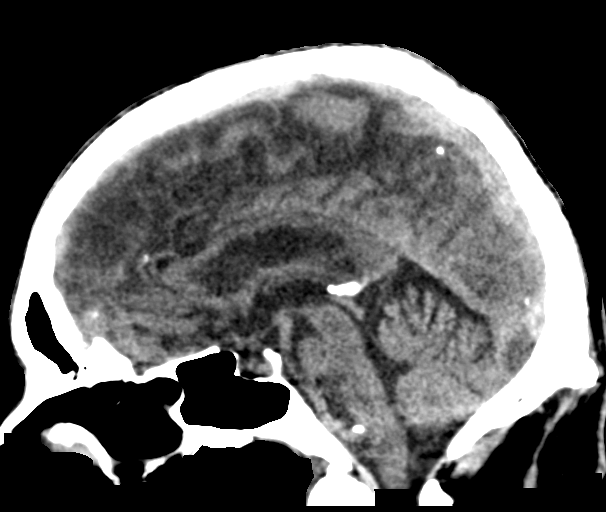
[im 40/60  brain]
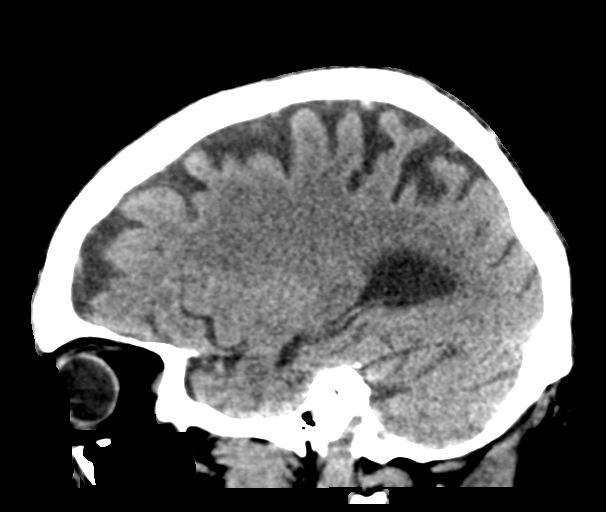

[17 of 47 positions shown; findings below may reference images not displayed]

FINDINGS: Brain: There is moderate severity cerebral atrophy with widening of
the extra-axial spaces and ventricular dilatation.
There are areas of decreased attenuation within the white matter
tracts of the supratentorial brain, consistent with microvascular
disease changes.

Vascular: No hyperdense vessel or unexpected calcification.

Skull: Normal. Negative for fracture or focal lesion.

Sinuses/Orbits: No acute finding.

Other: None.
IMPRESSION: 1. Generalized cerebral atrophy.
2. No acute intracranial abnormality.

## 2021-07-09 IMAGING — DX DG ABD PORTABLE 1V
1 series · 1 of 1 positions shown · non-contrast
Comparison: None.

CLINICAL DATA: 85-year-old male with NG placement.

EXAM:
PORTABLE ABDOMEN - 1 VIEW

[abdomen supine]
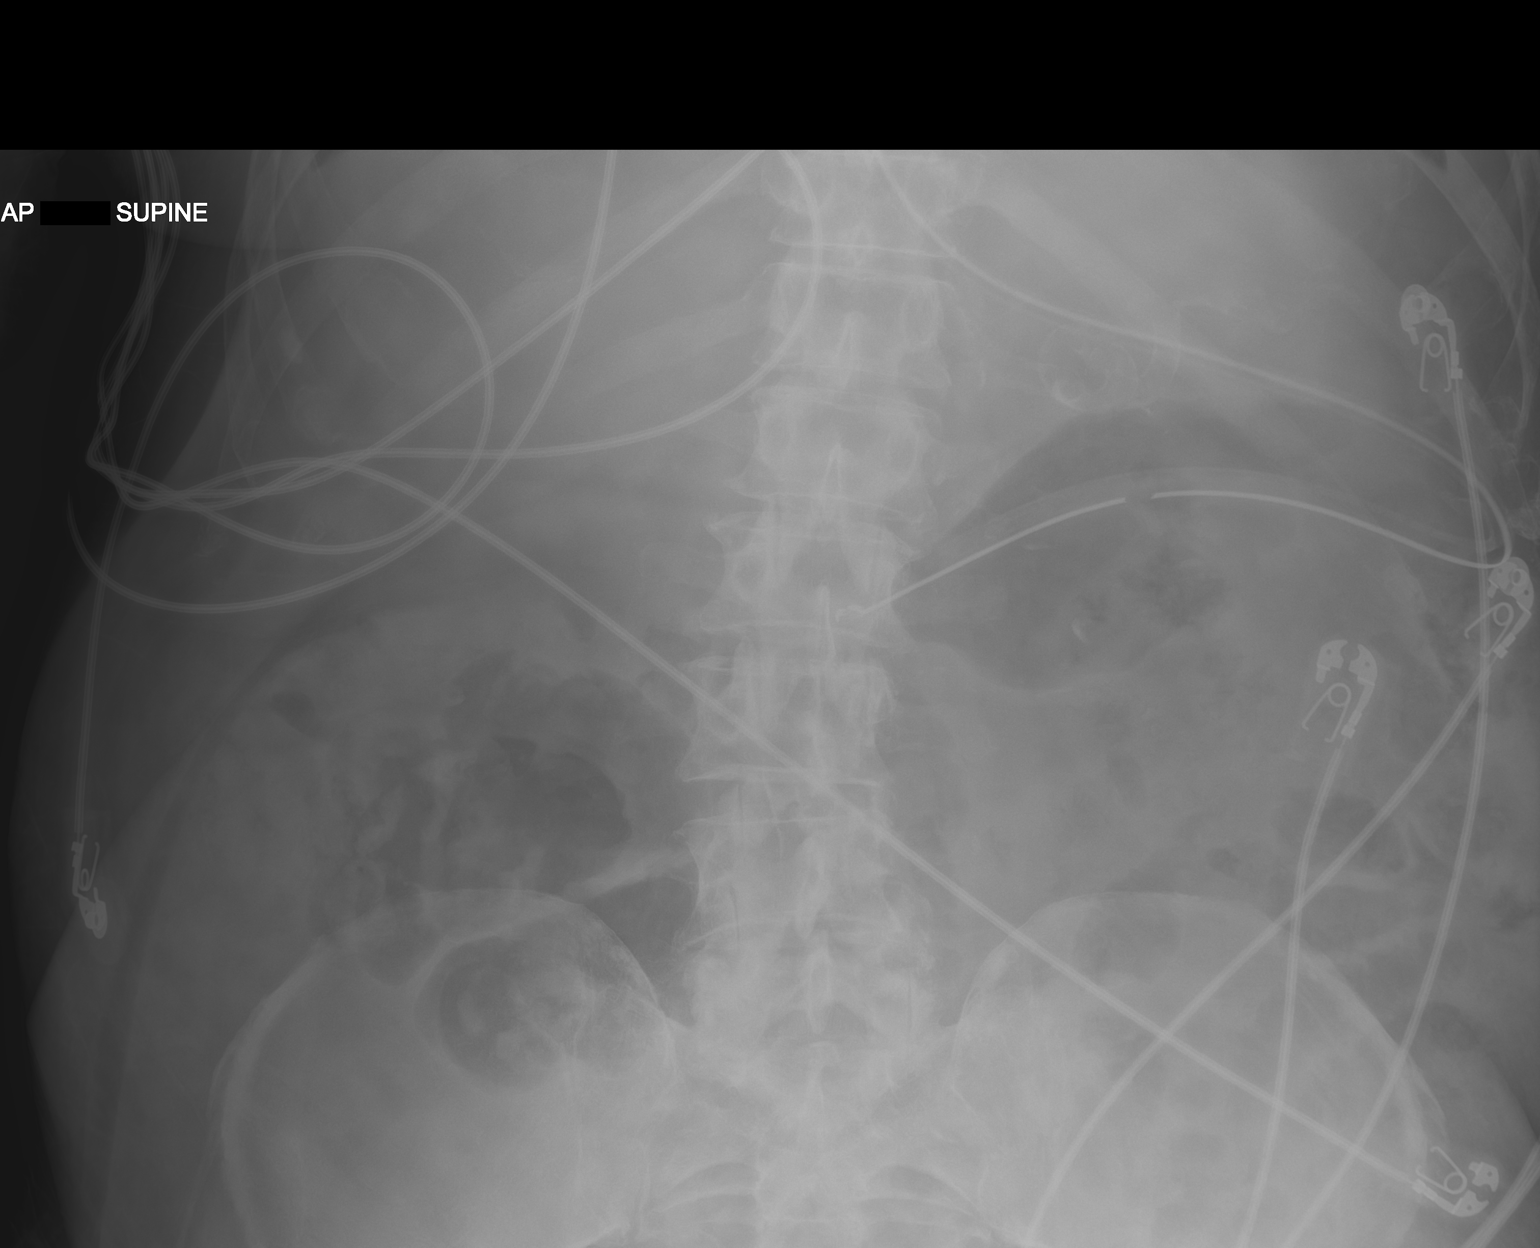

[1 of 1 positions shown; findings below may reference images not displayed]

FINDINGS: Partially visualized enteric tube with side-port in the body of the
stomach and tip in the distal stomach. Dilated small bowel loops in
the right lower abdomen measuring approximately 5 cm.
IMPRESSION: 1. Enteric tube with tip in the distal stomach.
2. Dilated small bowel loops in the right lower abdomen.

## 2021-07-09 IMAGING — MR MR HEAD W/O CM
12 of 13 series · 44 of 48 positions shown · non-contrast
Comparison: Prior head CT from earlier the same day.

CLINICAL DATA: Initial evaluation for mental status change, unknown
cause.

EXAM:
MRI HEAD WITHOUT CONTRAST
TECHNIQUE: Multiplanar, multiecho pulse sequences of the brain and surrounding
structures were obtained without intravenous contrast.

[Series 5: DWI · axial · 3.0mm · 0.88mm/px · z∈[-46,+109]mm · 8 of 106 slices shown (1 of 4)]
[im 1/106]
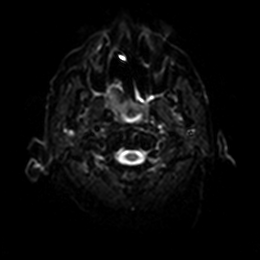
[im 16/106]
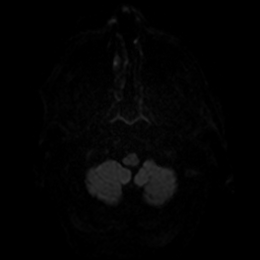
[im 31/106]
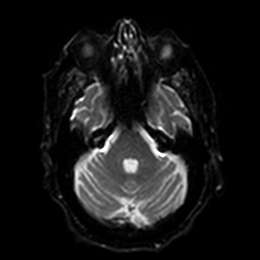
[im 46/106]
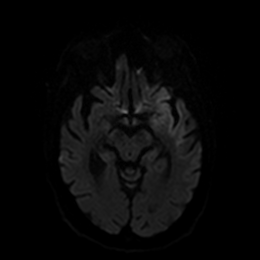
[im 61/106]
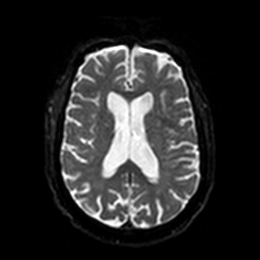
[im 76/106]
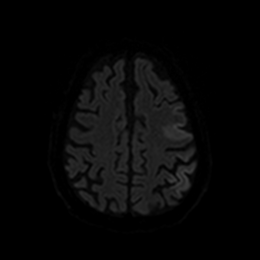
[im 91/106]
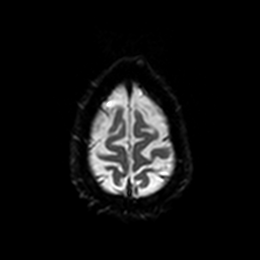
[im 106/106]
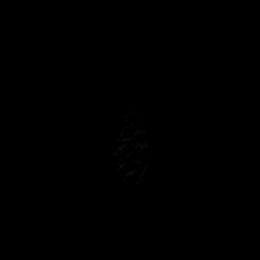

[Series 6: DWI · axial · 3.0mm · 0.88mm/px · z∈[-46,+109]mm · 4 of 52 slices shown (2 of 4)]
[im 1/52]
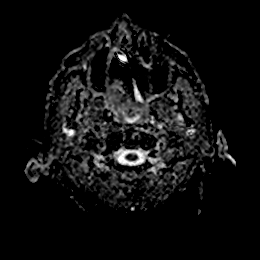
[im 18/52]
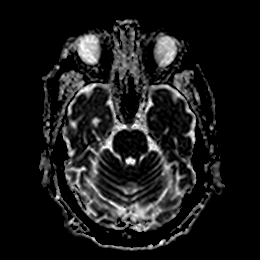
[im 35/52]
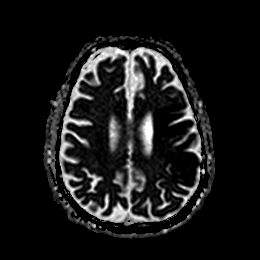
[im 52/52]
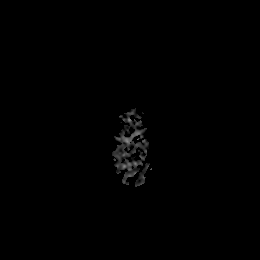

[Series 7: DWI · coronal · 4.0mm · 0.88mm/px · 5 of 72 slices shown (3 of 4)]
[im 1/72]
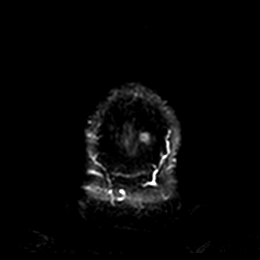
[im 18/72]
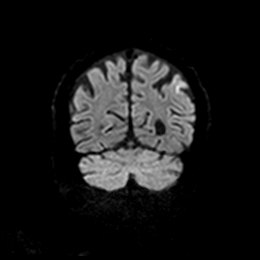
[im 36/72]
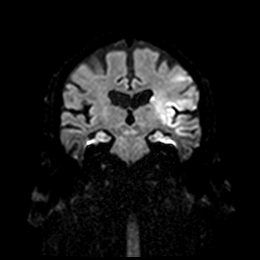
[im 54/72]
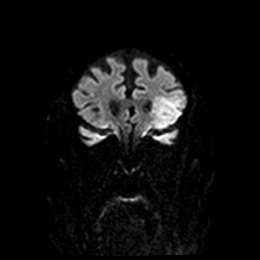
[im 72/72]
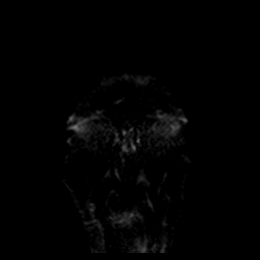

[Series 8: DWI · coronal · 4.0mm · 0.88mm/px · 3 of 36 slices shown (4 of 4)]
[im 1/36]
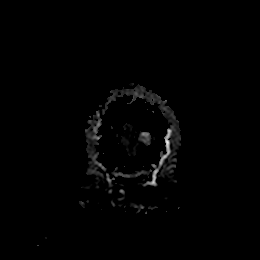
[im 18/36]
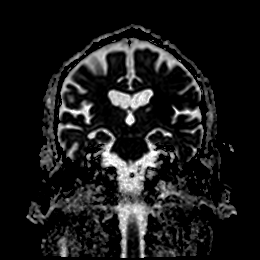
[im 36/36]
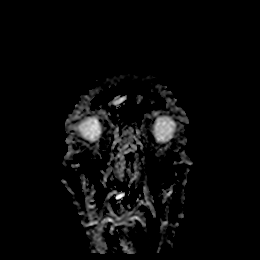

[Series 9: T1 · sagittal · 5.0mm · 0.75mm/px · 2 of 23 slices shown]
[im 1/23]
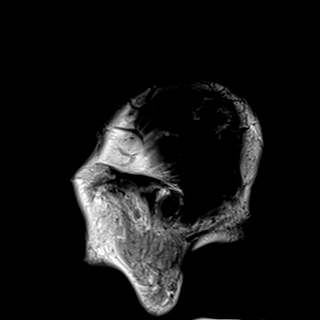
[im 23/23]
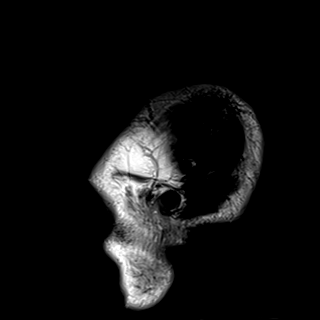

[Series 10: T2 · axial · 5.0mm · 0.72mm/px · z∈[-48,+112]mm · 2 of 28 slices shown (1 of 2)]
[im 1/28]
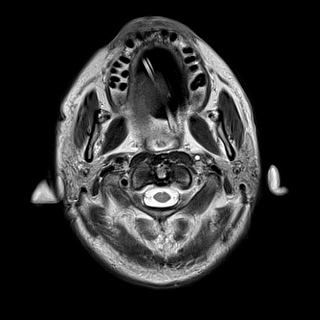
[im 28/28]
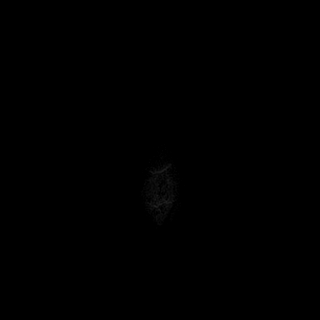

[Series 11: FLAIR · axial · 5.0mm · 0.45mm/px · z∈[-48,+112]mm · 2 of 28 slices shown]
[im 1/28]
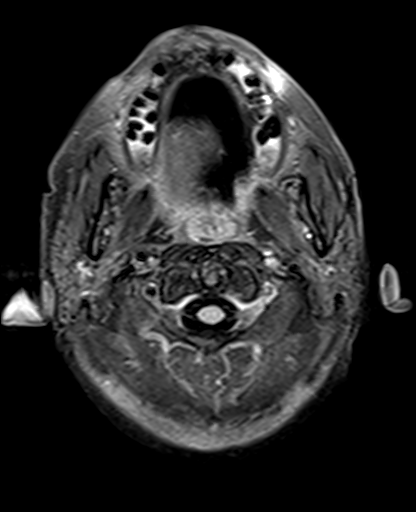
[im 28/28]
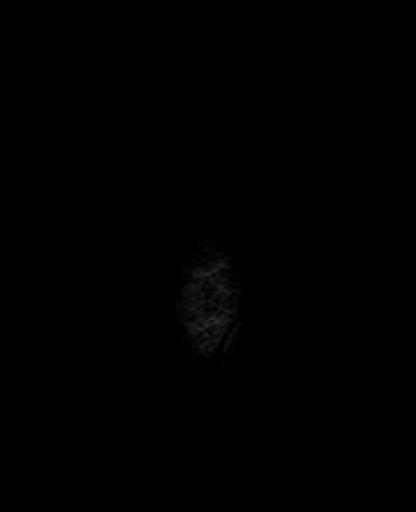

[Series 12: mag_images · axial · 3.0mm · 0.90mm/px · z∈[-50,+114]mm · 4 of 56 slices shown]
[im 1/56]
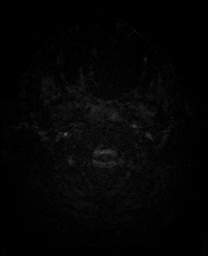
[im 19/56]
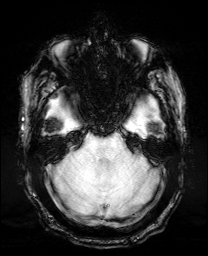
[im 37/56]
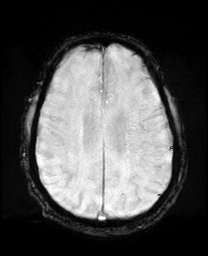
[im 56/56]
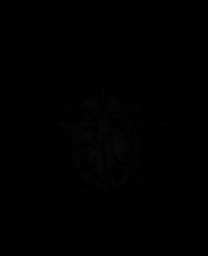

[Series 13: pha_images · axial · 3.0mm · 0.90mm/px · z∈[-50,+114]mm · 4 of 55 slices shown]
[im 1/55]
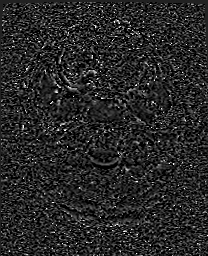
[im 19/55]
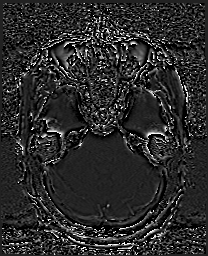
[im 37/55]
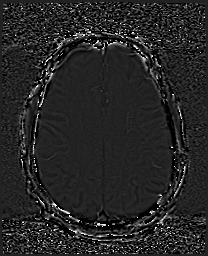
[im 55/55]
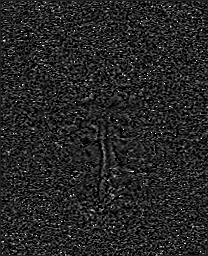

[Series 14: swi_images · axial · 3.0mm · 0.90mm/px · z∈[-50,+114]mm · 4 of 56 slices shown]
[im 1/56]
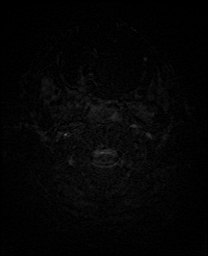
[im 19/56]
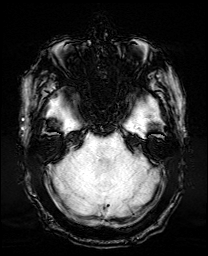
[im 37/56]
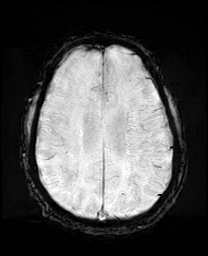
[im 56/56]
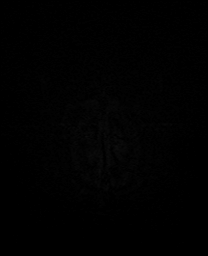

[Series 15: mip_images(sw) · axial · 24.0mm · 0.90mm/px · z∈[-39,+104]mm · 4 of 49 slices shown]
[im 1/49]
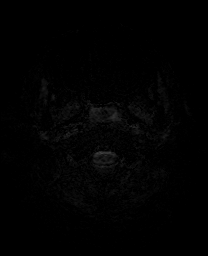
[im 17/49]
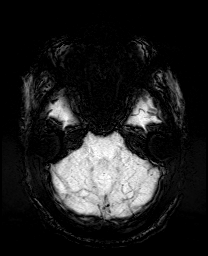
[im 33/49]
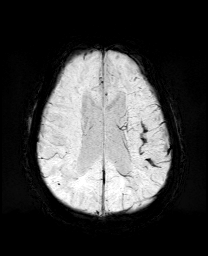
[im 49/49]
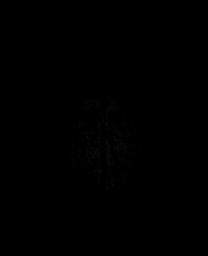

[Series 17: T2 · coronal · 5.0mm · 0.34mm/px · 2 of 31 slices shown (2 of 2)]
[im 1/31]
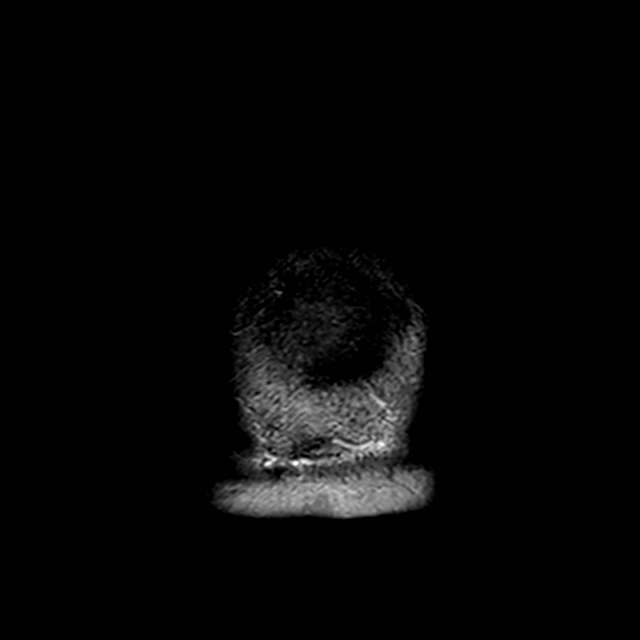
[im 31/31]
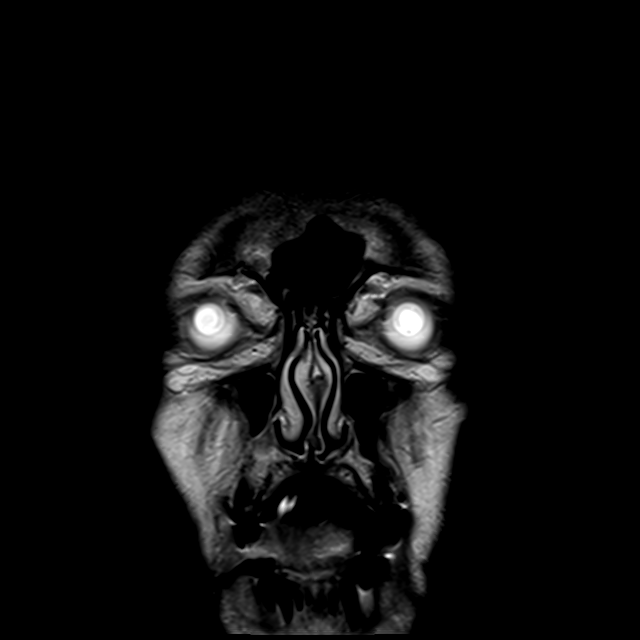

[44 of 48 positions shown; findings below may reference images not displayed]

FINDINGS: Brain: Generalized age-related cerebral atrophy. There is abnormal
restricted diffusion involving a large portion of the left MCA
distribution, with involvement of the left frontal, parietal, and
temporal lobes. Involvement of the left insular cortex as well as
the underlying left basal ganglia and corona radiata. No significant
regional mass effect at this time. Serpiginous susceptibility
artifact within the area of infarction most consistent with
petechial hemorrhage, although intraluminal thrombus within distal
MCA branches could be contributory as well (series 14, image 32). No
frank intraparenchymal hematoma.

No other evidence for acute or subacute ischemia. Gray-white matter
differentiation otherwise maintained. No encephalomalacia to suggest
chronic cortical infarction. Few small remote lacunar infarcts noted
about the basal ganglia and left thalamus.

No mass lesion or midline shift. No hydrocephalus or extra-axial
fluid collection. Pituitary gland and suprasellar region within
normal limits. Midline structures intact.

Vascular: Abnormal flow void seen within the left ICA to the level
of the terminus, with extension into the left M1 segment and its
distal branches. Again, above described susceptibility artifact at
the level of the sylvian fissure could reflect petechial hemorrhage
and/or intraluminal thrombus within distal MCA branches.

Additionally, there is loss of normal flow void within the left
V3/V4 segments, which could be related to slow flow and/or occlusion
(series 10, image 6). Otherwise, major intracranial vascular flow
voids are maintained.

Skull and upper cervical spine: Craniocervical junction within
normal limits. Bone marrow signal intensity normal. No scalp soft
tissue abnormality.

Sinuses/Orbits: Sequelae of prior bilateral ocular lens replacement.
Globes and orbital soft tissues demonstrate no other acute finding.
Scattered mucosal thickening noted within the ethmoidal air cells.
Enteric tube partially visualized. Associated layering fluid noted
within the nasopharynx. No significant mastoid effusion. Inner ear
structures grossly normal.

Other: None.
IMPRESSION: 1. Fairly large evolving acute ischemic left MCA distribution
infarct as detailed above. Superimposed susceptibility artifact
suggestive of associated petechial hemorrhage, although intraluminal
thrombus within distal left MCA branches may be contributory as
well. No frank intraparenchymal hematoma or significant regional
mass effect at this time.
2. Abnormal flow void within the left ICA to the level of the
terminus, likely occluded. Irregular flow void within the left MCA
distribution could be related to occlusion and/or slow flow as well.
3. Few underlying small remote lacunar infarcts within the bilateral
basal ganglia and left thalamus.
4. Abnormal flow void within the left the V3/V4 segments, which
could be related to slow flow and/or occlusion.

Critical Value/emergent results were called by telephone at the time
of interpretation on [DATE] at [DATE] to provider Dr. RONLOR
RONLOR, who verbally acknowledged these results.

## 2021-07-09 IMAGING — CT CT HEAD W/O CM
4 series · 16 of 47 positions shown, 18 images · non-contrast
Comparison: Head CT from earlier today.

CLINICAL DATA: Mental status change, unknown cause. No reported
injury.

EXAM:
CT HEAD WITHOUT CONTRAST
TECHNIQUE: Contiguous axial images were obtained from the base of the skull
through the vertex without intravenous contrast.

[Series 2: head without · axial · non-contrast · 0.47mm/px · z∈[-196,-61]mm · 7 of 37 slices shown, 9 images]
[im 5/37  brain]
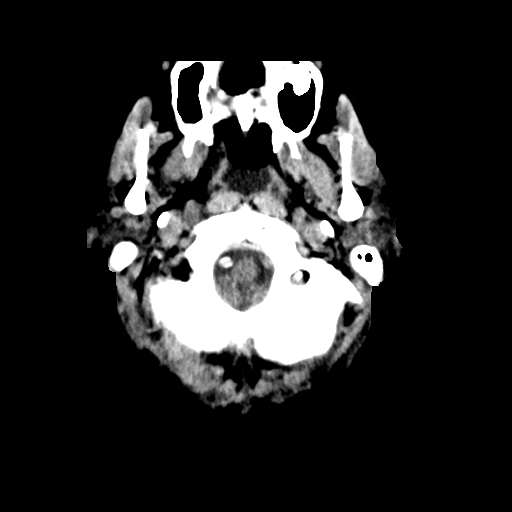
[im 5/37  bone]
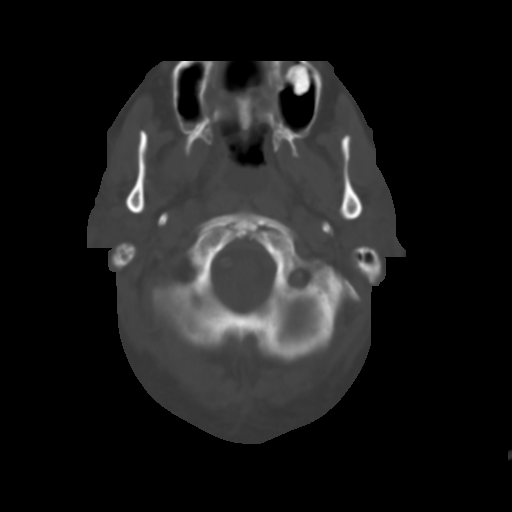
[im 10/37  brain]
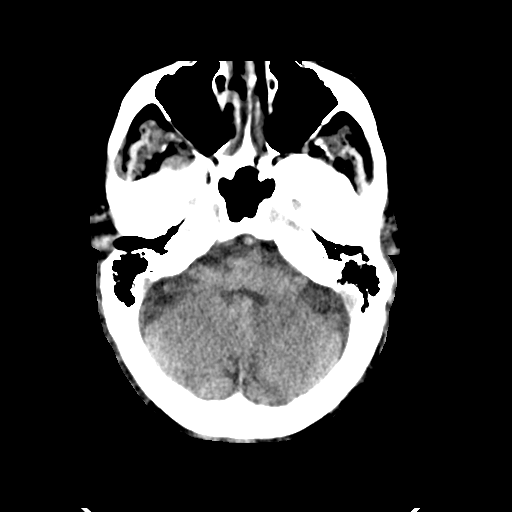
[im 14/37  brain]
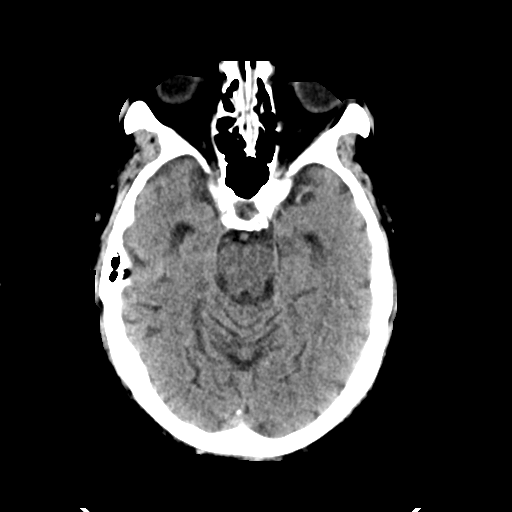
[im 19/37  brain]
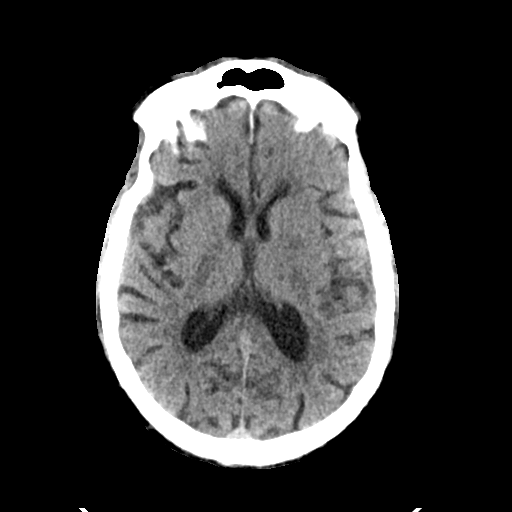
[im 23/37  brain]
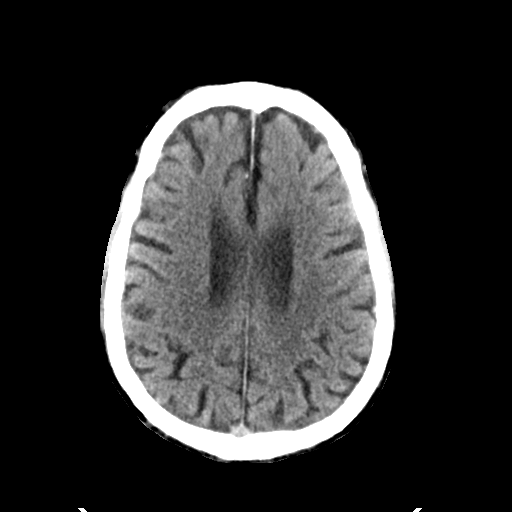
[im 23/37  bone]
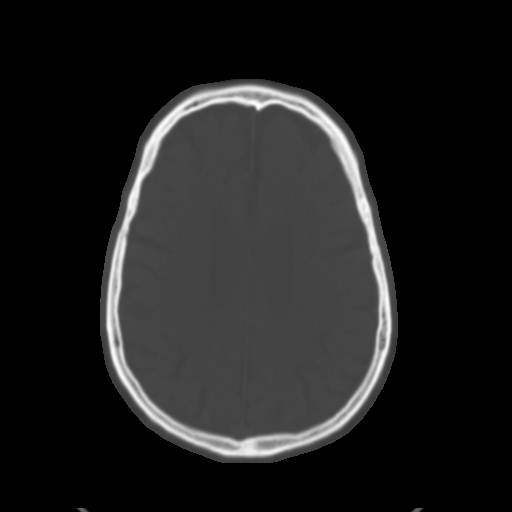
[im 28/37  brain]
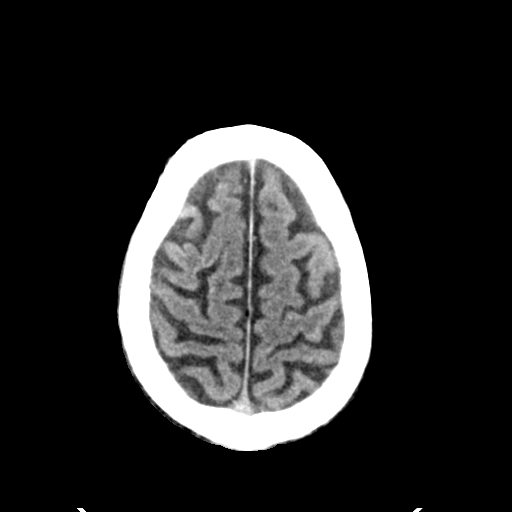
[im 32/37  brain]
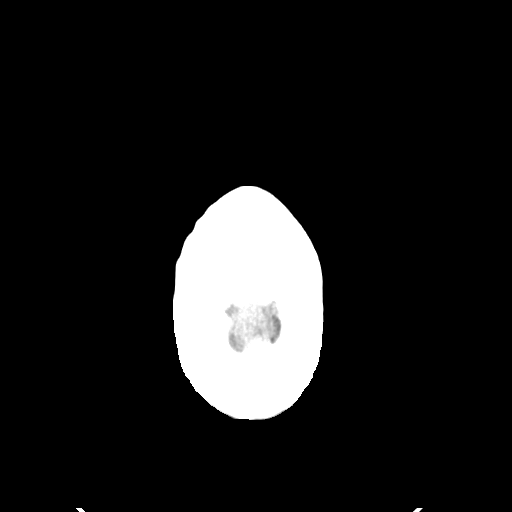

[Series 3: head bone · axial · 0.47mm/px · z∈[-198,-162]mm · 3 of 91 slices shown]
[im 10/91  bone]
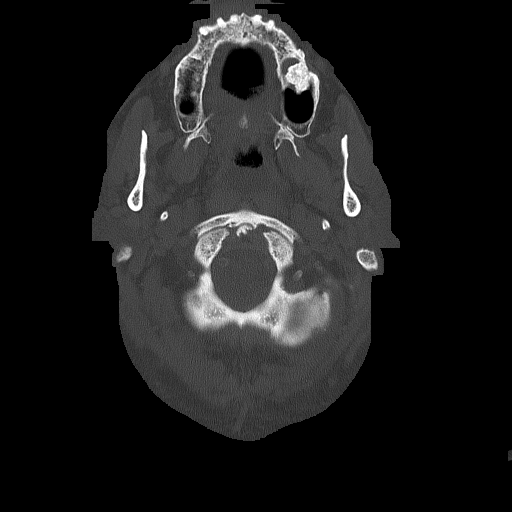
[im 19/91  bone]
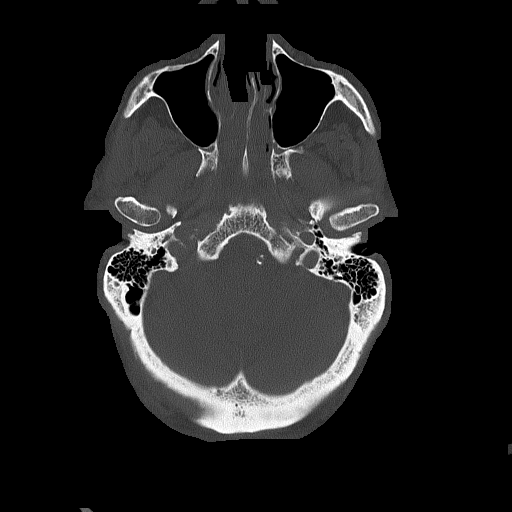
[im 28/91  bone]
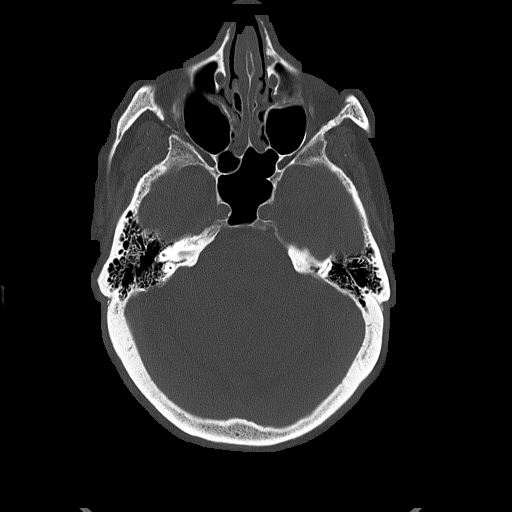

[Series 4: head without cor · coronal · non-contrast · 0.37mm/px · 3 of 75 slices shown]
[im 25/75  brain]
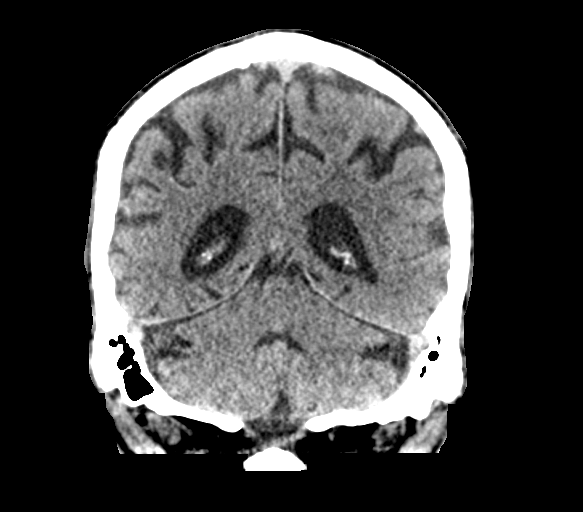
[im 33/75  brain]
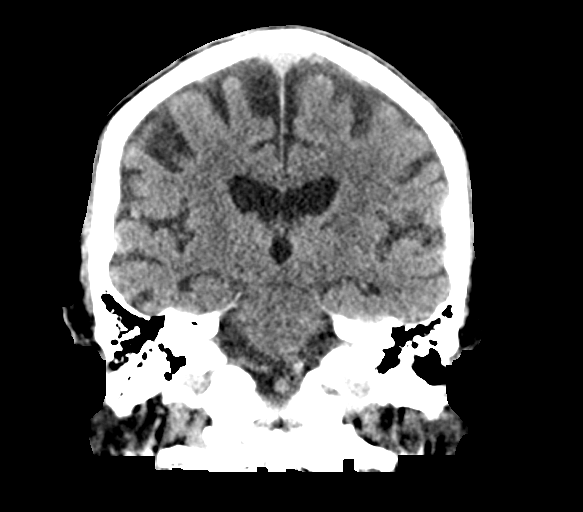
[im 42/75  brain]
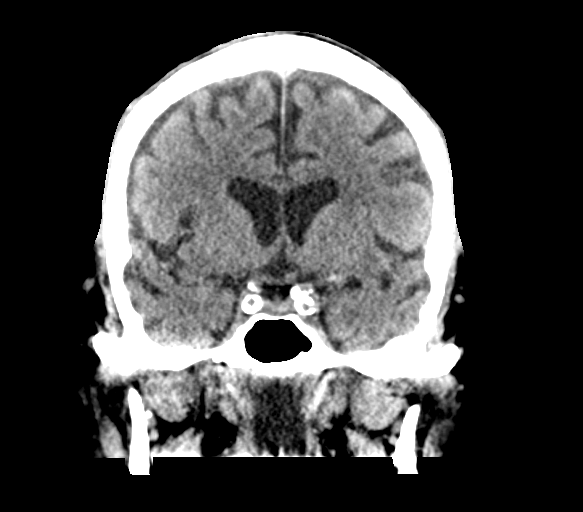

[Series 5: head without sag · sagittal · non-contrast · 0.36mm/px · 3 of 65 slices shown]
[im 22/65  brain]
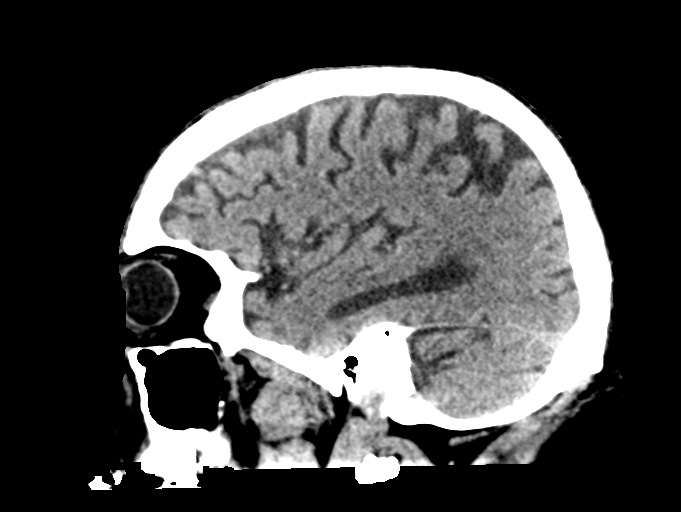
[im 33/65  brain]
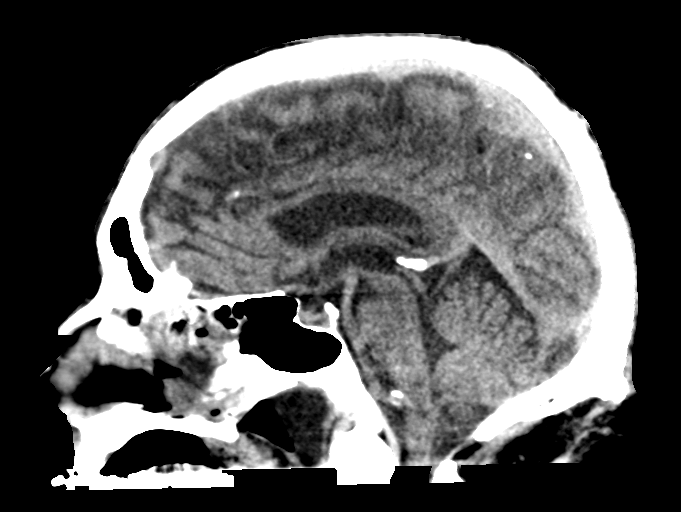
[im 43/65  brain]
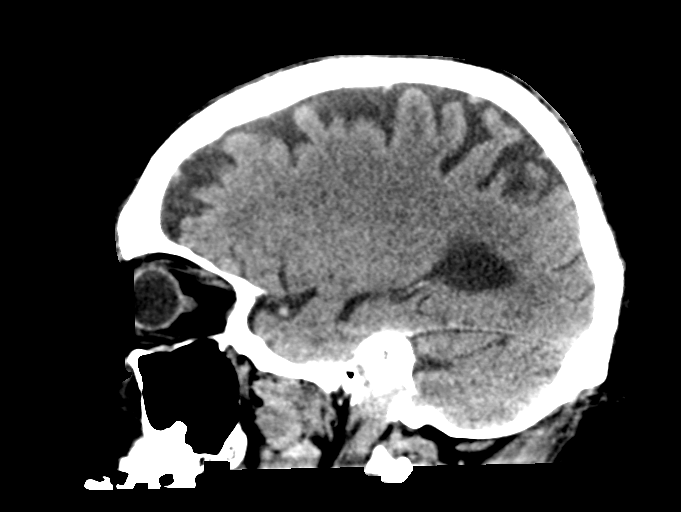

[16 of 47 positions shown; findings below may reference images not displayed]

FINDINGS: Brain: No evidence of parenchymal hemorrhage or extra-axial fluid
collection. No mass lesion, mass effect, or midline shift. No CT
evidence of acute infarction. Generalized cerebral volume loss.
Nonspecific mild subcortical and periventricular white matter
hypodensity, most in keeping with chronic small vessel ischemic
change. No ventriculomegaly.

Vascular: No acute abnormality.

Skull: No evidence of calvarial fracture.

Sinuses/Orbits: No fluid levels. Mucoperiosteal thickening
throughout the bilateral ethmoidal air cells.

Other:  The mastoid air cells are unopacified.
IMPRESSION: 1. No evidence of acute intracranial abnormality.
2. Generalized cerebral volume loss and mild chronic small vessel
ischemic changes in the cerebral white matter.
3. Mild chronic appearing paranasal sinusitis.

## 2021-07-09 MED ORDER — POLYETHYLENE GLYCOL 3350 17 G PO PACK
17.0000 g | PACK | Freq: Every day | ORAL | Status: DC
  Administered 2021-07-10 – 2021-07-14 (×5): 17 g
  Filled 2021-07-09 (×5): qty 1

## 2021-07-09 MED ORDER — CHLORHEXIDINE GLUCONATE CLOTH 2 % EX PADS
6.0000 | MEDICATED_PAD | Freq: Every day | CUTANEOUS | Status: DC
  Administered 2021-07-09 – 2021-07-14 (×6): 6 via TOPICAL

## 2021-07-09 MED ORDER — MIDAZOLAM HCL 2 MG/2ML IJ SOLN: 1.0000 mg | INTRAMUSCULAR | Status: DC | PRN

## 2021-07-09 MED ORDER — MIDAZOLAM HCL 2 MG/2ML IJ SOLN
2.0000 mg | Freq: Once | INTRAMUSCULAR | Status: AC
  Administered 2021-07-09: 2 mg via INTRAVENOUS
  Filled 2021-07-09: qty 2

## 2021-07-09 MED ORDER — SACUBITRIL-VALSARTAN 97-103 MG PO TABS
1.0000 | ORAL_TABLET | Freq: Two times a day (BID) | ORAL | Status: DC
  Administered 2021-07-09: 1 via ORAL
  Filled 2021-07-09 (×2): qty 1

## 2021-07-09 MED ORDER — DOCUSATE SODIUM 50 MG/5ML PO LIQD
100.0000 mg | Freq: Two times a day (BID) | ORAL | Status: DC
  Administered 2021-07-09 – 2021-07-14 (×10): 100 mg
  Filled 2021-07-09 (×10): qty 10

## 2021-07-09 MED ORDER — CHLORHEXIDINE GLUCONATE 0.12% ORAL RINSE (MEDLINE KIT)
15.0000 mL | Freq: Two times a day (BID) | OROMUCOSAL | Status: DC
  Administered 2021-07-09 – 2021-07-14 (×10): 15 mL via OROMUCOSAL

## 2021-07-09 MED ORDER — MIDAZOLAM HCL 2 MG/2ML IJ SOLN
INTRAMUSCULAR | Status: AC
  Administered 2021-07-09: 2 mg via INTRAVENOUS
  Filled 2021-07-09: qty 2

## 2021-07-09 MED ORDER — FENTANYL CITRATE (PF) 100 MCG/2ML IJ SOLN: 100.0000 ug | Freq: Once | INTRAMUSCULAR | Status: DC

## 2021-07-09 MED ORDER — SPIRONOLACTONE 12.5 MG HALF TABLET
12.5000 mg | ORAL_TABLET | Freq: Every day | ORAL | Status: DC
  Administered 2021-07-09: 12.5 mg via ORAL
  Filled 2021-07-09: qty 1

## 2021-07-09 MED ORDER — MIDAZOLAM HCL 2 MG/2ML IJ SOLN: 2.0000 mg | Freq: Once | INTRAMUSCULAR | Status: AC

## 2021-07-09 MED ORDER — ASPIRIN 81 MG PO CHEW
81.0000 mg | CHEWABLE_TABLET | Freq: Every day | ORAL | Status: DC
  Administered 2021-07-09 – 2021-07-14 (×6): 81 mg
  Filled 2021-07-09 (×6): qty 1

## 2021-07-09 MED ORDER — SODIUM CHLORIDE 0.9 % IV SOLN: INTRAVENOUS | Status: DC

## 2021-07-09 MED ORDER — FENTANYL CITRATE (PF) 100 MCG/2ML IJ SOLN
INTRAMUSCULAR | Status: AC
  Filled 2021-07-09: qty 2

## 2021-07-09 MED ORDER — ETOMIDATE 2 MG/ML IV SOLN
20.0000 mg | Freq: Once | INTRAVENOUS | Status: AC
  Administered 2021-07-09: 20 mg via INTRAVENOUS

## 2021-07-09 MED ORDER — ENOXAPARIN SODIUM 30 MG/0.3ML IJ SOSY
30.0000 mg | PREFILLED_SYRINGE | INTRAMUSCULAR | Status: DC
  Administered 2021-07-09: 30 mg via SUBCUTANEOUS
  Filled 2021-07-09: qty 0.3

## 2021-07-09 MED ORDER — MIDAZOLAM HCL 2 MG/2ML IJ SOLN
1.0000 mg | INTRAMUSCULAR | Status: DC | PRN
  Filled 2021-07-09: qty 2

## 2021-07-09 MED ORDER — ATORVASTATIN CALCIUM 40 MG PO TABS: 40.0000 mg | ORAL_TABLET | Freq: Every day | ORAL | Status: DC

## 2021-07-09 MED ORDER — FUROSEMIDE 20 MG PO TABS: 20.0000 mg | ORAL_TABLET | Freq: Every day | ORAL | Status: DC

## 2021-07-09 MED ORDER — ISOSORBIDE MONONITRATE ER 30 MG PO TB24
30.0000 mg | ORAL_TABLET | Freq: Every day | ORAL | Status: DC
  Administered 2021-07-09: 30 mg via ORAL
  Filled 2021-07-09: qty 1

## 2021-07-09 MED ORDER — SPIRONOLACTONE 12.5 MG HALF TABLET
12.5000 mg | ORAL_TABLET | Freq: Every day | ORAL | Status: DC
  Filled 2021-07-09: qty 1

## 2021-07-09 MED ORDER — ROCURONIUM BROMIDE 50 MG/5ML IV SOLN
50.0000 mg | Freq: Once | INTRAVENOUS | Status: AC
  Administered 2021-07-09: 50 mg via INTRAVENOUS
  Filled 2021-07-09: qty 5

## 2021-07-09 MED ORDER — PANTOPRAZOLE SODIUM 40 MG IV SOLR
40.0000 mg | Freq: Every day | INTRAVENOUS | Status: DC
  Administered 2021-07-09 – 2021-07-12 (×4): 40 mg via INTRAVENOUS
  Filled 2021-07-09 (×4): qty 40

## 2021-07-09 MED ORDER — SACUBITRIL-VALSARTAN 97-103 MG PO TABS
1.0000 | ORAL_TABLET | Freq: Two times a day (BID) | ORAL | Status: DC
  Filled 2021-07-09: qty 1

## 2021-07-09 MED ORDER — FUROSEMIDE 20 MG PO TABS
20.0000 mg | ORAL_TABLET | Freq: Every day | ORAL | Status: DC
  Administered 2021-07-09: 20 mg via ORAL
  Filled 2021-07-09: qty 1

## 2021-07-09 MED ORDER — METOPROLOL SUCCINATE ER 50 MG PO TB24
50.0000 mg | ORAL_TABLET | Freq: Every day | ORAL | Status: DC
  Administered 2021-07-09: 50 mg via ORAL
  Filled 2021-07-09: qty 2

## 2021-07-09 MED ORDER — SODIUM CHLORIDE 0.9 % IV SOLN: 250.0000 mL | INTRAVENOUS | Status: DC

## 2021-07-09 MED ORDER — FENTANYL 2500MCG IN NS 250ML (10MCG/ML) PREMIX INFUSION
0.0000 ug/h | INTRAVENOUS | Status: DC
  Administered 2021-07-09: 25 ug/h via INTRAVENOUS
  Filled 2021-07-09: qty 250

## 2021-07-09 MED ORDER — ATORVASTATIN CALCIUM 40 MG PO TABS
40.0000 mg | ORAL_TABLET | Freq: Every day | ORAL | Status: DC
  Administered 2021-07-09: 40 mg via ORAL
  Filled 2021-07-09: qty 1

## 2021-07-09 MED ORDER — PHENYLEPHRINE HCL-NACL 10-0.9 MG/250ML-% IV SOLN
25.0000 ug/min | INTRAVENOUS | Status: DC
  Administered 2021-07-09: 50 ug/min via INTRAVENOUS
  Administered 2021-07-10: 90 ug/min via INTRAVENOUS
  Administered 2021-07-10: 60 ug/min via INTRAVENOUS
  Administered 2021-07-10: 100 ug/min via INTRAVENOUS
  Administered 2021-07-10: 80 ug/min via INTRAVENOUS
  Filled 2021-07-09 (×7): qty 250

## 2021-07-09 MED ORDER — SODIUM CHLORIDE 0.9 % IV BOLUS
500.0000 mL | Freq: Once | INTRAVENOUS | Status: AC
  Administered 2021-07-09: 500 mL via INTRAVENOUS

## 2021-07-09 MED ORDER — ORAL CARE MOUTH RINSE
15.0000 mL | OROMUCOSAL | Status: DC
  Administered 2021-07-09 – 2021-07-14 (×46): 15 mL via OROMUCOSAL

## 2021-07-09 NOTE — ED Notes (Signed)
Pt continues to move his left arm. Pt started moving both of his legs as well. Drip increased. MD notified to get orders for additional sedation meds. Pending new orders at this time. Son continues to be at bedside.

## 2021-07-09 NOTE — ED Notes (Signed)
Pt left arm started moving more at this time. Fentanyl IV increased.

## 2021-07-09 NOTE — Consult Note (Signed)
NAME:  Deshon Hsiao, MRN:  [id number], DOB:  1936-01-10, LOS: 0 ADMISSION DATE:  07/20/21, CONSULTATION DATE:  7/28 REFERRING MD:  FPTS, CHIEF COMPLAINT:  respiratory failure    History of Present Illness:  85yo male with hx HTN, CHF, CKDIII who tested POS for COVID 7/20 (home test, unknown vaccination status) presented 7/28 with 3 days AMS, poor balance. He was seen in ER where initial w/u was essentially NEG with normal head CT, reassuring neuro exam and was being admitted by FPTS.  However while in ER he became agitated, then somnolent then had acute unresponsiveness and PCCM called urgently for intubation and ICU admission.   Pertinent  Medical History   has a past medical history of Congestive heart failure (CHF) (HCC), Hyperlipidemia, and Hypertension.   Significant Hospital Events: Including procedures, antibiotic start and stop dates in addition to other pertinent events   CT head 7/28>>neg acute   Interim History / Subjective:  As above   Objective   Blood pressure (!) 155/97, pulse (!) 105, temperature 97.6 F (36.4 C), temperature source Oral, resp. rate (!) 24, height 5\' 8"  (1.727 m), weight 84.3 kg, SpO2 96 %.       No intake or output data in the 24 hours ending 07/09/21 1552 Filed Weights   07/09/21 0930  Weight: 84.3 kg    Examination: Deferred to provider   Resolved Hospital Problem list     Assessment & Plan:  Acute respiratory failure  - r/t AMS, inability to protect airway. No respiratory complaints initially, no SOB COVID infection  P: Continue ventilator support with lung protective strategies  Wean PEEP and FiO2 for sats greater than 90%. Head of bed elevated 30 degrees. Plateau pressures less than 30 cm H20.  Follow intermittent chest x-ray and ABG.   SAT/SBT as tolerated, mentation preclude extubation  Ensure adequate pulmonary hygiene  Follow cultures  VAP bundle in place  PAD protocol Hold off on further covid treatment for now as resp  failure seems more driven by mental status   AMS  - initial CT head neg. Recent covid infection  P: Consult neurology  Stat head CTA Maintain neuro protective measures; goal for eurothermia, euglycemia, eunatermia, normoxia, and PCO2 goal of 35-40 Nutrition and bowel regiment  Seizure precautions  AEDs per neurology  Aspirations precautions  Minimize sedation   Hx of HFrEF -Echo 02/18/2021 with EF 25-30% and grade 2 diastolic dysfunction, he follow Dr. 04/20/2021  Hx of HTN/HLD -Home medications include lipitor, lasix, imdur, toprol-xl, entresto, and aldactone  P: Continuous telemetry  Obtain/monitor BNP Strict intake and output  Daily weight to assess volume status Daily assessment for need to diurese Closely monitor renal function and electrolytes  Hold home antihypertensives   AKI superimposed on CKD stage IIIb -Baseline creatinine 1.8, creatinine on admission 2.27 P: Follow renal function  Monitor urine output Trend Bmet Avoid nephrotoxins Ensure adequate renal perfusion  IV hydration Obtain urine lytes  Consider renal ultrasound   Hx of diabetes  -Home medications include; glipizide and Jardiance  P: Hold home medications SSI CBG q4  Best Practice   Diet/type: NPO DVT prophylaxis: SCD GI prophylaxis: PPI Lines: N/A Foley:  N/A Code Status:  full code Last date of multidisciplinary goals of care discussion: Pending   Labs   CBC: Recent Labs  Lab 20-Jul-2021 2236 07/09/21 1546  WBC 6.0  --   NEUTROABS 4.6  --   HGB 11.8* 14.6  HCT 35.1* 43.0  MCV 100.0  --  PLT 319  --     Basic Metabolic Panel: Recent Labs  Lab 06/21/2021 2236 07/09/21 1546  NA 137 144  K 4.1 4.2  CL 106  --   CO2 24  --   GLUCOSE 258*  --   BUN 47*  --   CREATININE 2.27*  --   CALCIUM 9.4  --    GFR: Estimated Creatinine Clearance: 26.1 mL/min (A) (by C-G formula based on SCr of 2.27 mg/dL (H)). Recent Labs  Lab 07/07/2021 2236 07/09/21 0600  WBC 6.0  --    LATICACIDVEN 1.4 0.8    Liver Function Tests: Recent Labs  Lab 07/09/2021 2236  AST 33  ALT 45*  ALKPHOS 67  BILITOT 0.4  PROT 5.9*  ALBUMIN 2.8*   No results for input(s): LIPASE, AMYLASE in the last 168 hours. No results for input(s): AMMONIA in the last 168 hours.  ABG    Component Value Date/Time   HCO3 25.8 07/09/2021 1546   TCO2 28 07/09/2021 1546   ACIDBASEDEF 3.0 (H) 07/09/2021 1546   O2SAT 91.0 07/09/2021 1546     Coagulation Profile: Recent Labs  Lab 07/09/21 0600  INR 1.2    Cardiac Enzymes: No results for input(s): CKTOTAL, CKMB, CKMBINDEX, TROPONINI in the last 168 hours.  HbA1C: Hgb A1c MFr Bld  Date/Time Value Ref Range Status  12/10/2020 11:46 AM 5.9 (H) 4.8 - 5.6 % Final    Comment:             Prediabetes: 5.7 - 6.4          Diabetes: >6.4          Glycemic control for adults with diabetes: <7.0     CBG: Recent Labs  Lab 07/09/21 1527  GLUCAP 247*    Review of Systems:   Unable to assess   Past Medical History:  He,  has a past medical history of Congestive heart failure (CHF) (HCC), Hyperlipidemia, and Hypertension.   Surgical History:  No past surgical history on file.   Social History:   reports that he has never smoked. He has never used smokeless tobacco. He reports previous alcohol use. He reports that he does not use drugs.   Family History:  His family history includes Alzheimer's disease in his sister; Cancer in his brother; Diabetes in his brother and mother; Hypertension in his brother and mother; Stroke in his brother.   Allergies Not on File   Home Medications  Prior to Admission medications   Medication Sig Start Date End Date Taking? Authorizing Provider  atorvastatin (LIPITOR) 40 MG tablet Take 1 tablet (40 mg total) by mouth at bedtime. 01/28/21  Yes Bensimhon, Bevelyn Buckles, MD  Cholecalciferol (VITAMIN D3 PO) Take 1 tablet by mouth daily in the afternoon.   Yes [provider]  co-enzyme Q-10 30 MG  capsule Take 30 mg by mouth daily.   Yes [provider]  ELDERBERRY PO Take 1 capsule by mouth daily.   Yes [provider]  empagliflozin (JARDIANCE) 10 MG TABS tablet Take 1 tablet (10 mg total) by mouth daily before breakfast. 01/28/21  Yes Bensimhon, Bevelyn Buckles, MD  furosemide (LASIX) 40 MG tablet Take 0.5 tablets (20 mg total) by mouth daily. 02/18/21  Yes Bensimhon, Bevelyn Buckles, MD  glipiZIDE (GLUCOTROL) 5 MG tablet Take 5 mg by mouth daily. 02/19/21  Yes [provider]  isosorbide mononitrate (IMDUR) 30 MG 24 hr tablet Take 1 tablet (30 mg total) by mouth daily. 01/28/21  Yes Bensimhon, Bevelyn Buckles, MD  metoprolol succinate (TOPROL-XL) 50 MG 24 hr tablet Take 1 tablet (50 mg total) by mouth daily. 01/28/21  Yes Bensimhon, Bevelyn Buckles, MD  Multiple Vitamin (MULTIVITAMIN) capsule Take 1 capsule by mouth daily.   Yes [provider]  Omega-3 Fatty Acids (FISH OIL) 1000 MG CAPS Take 1 capsule by mouth 2 (two) times daily.   Yes [provider]  Probiotic Product (PROBIOTIC-10 PO) Take 1 capsule by mouth daily.   Yes [provider]  sacubitril-valsartan (ENTRESTO) 97-103 MG Take 1 tablet by mouth 2 (two) times daily. 03/12/21  Yes Bensimhon, Bevelyn Buckles, MD  spironolactone (ALDACTONE) 25 MG tablet Take 0.5 tablets (12.5 mg total) by mouth daily. 03/02/21  Yes Bensimhon, Bevelyn Buckles, MD  vitamin E 180 MG (400 UNITS) capsule Take 400 Units by mouth daily.   Yes [provider]     Critical care time:   CRITICAL CARE Performed by: Jarriel Papillion D. Harris  Total critical care time: 42 minutes  Critical care time was exclusive of separately billable procedures and treating other patients.  Critical care was necessary to treat or prevent imminent or life-threatening deterioration.  Critical care was time spent personally by me on the following activities: development of treatment plan with patient and/or surrogate as well as nursing, discussions with  consultants, evaluation of patient's response to treatment, examination of patient, obtaining history from patient or surrogate, ordering and performing treatments and interventions, ordering and review of laboratory studies, ordering and review of radiographic studies, pulse oximetry and re-evaluation of patient's condition.  Marland Kitchenwfd

## 2021-07-09 NOTE — ED Notes (Signed)
Pt son currently at bedside. Updated regarding treatment plan. Son was updated regarding pt fall earlier today. Also, explained to pt son the 4 pt restraints. Offered to remove restraints at this time as son is at bedside. Son states it is better to leave pt on 4 pt restraints at this time so pt could rest. Pt has not had sleep all night as pt was constantly wanting to go home. Constantly attempted to stand up and remove medical equipment for cardiac monitoring. Will continue to monitor.

## 2021-07-09 NOTE — ED Provider Notes (Signed)
[facility] EMERGENCY DEPARTMENT Provider Note   CSN: [id number] Arrival date & time: 06/16/2021  2122     History Chief complaint: Confusion  Tristan Hart is a 85 y.o. male.  The history is provided by a relative.  He has history of hypertension, hyperlipidemia, chronic kidney disease, congestive heart failure and was diagnosed with COVID-19 8 days ago.  Over the last 2 days, he has had progressive confusion as well as worsening balance.  Son also was concerned that there may have been a slight left facial droop.  He initially had a low-grade fever, never higher than 100.  He also had a slight cough.  There has been no vomiting or diarrhea.  Home COVID test yesterday was only faintly positive.   Past Medical History:  Diagnosis Date   Congestive heart failure (CHF) (HCC)    Hyperlipidemia    Hypertension     Patient Active Problem List   Diagnosis Date Noted   CKD stage G3b/A3, GFR 30-44 and albumin creatinine ratio >300 mg/g (HCC) 01/06/2021   Hyperlipidemia 12/10/2020   HFrEF (heart failure with reduced ejection fraction) (HCC) 12/10/2020   Primary hypertension 12/10/2020   Pre-diabetes 12/10/2020   Depressed mood 12/10/2020    No past surgical history on file.     Family History  Problem Relation Age of Onset   Diabetes Mother    Hypertension Mother    Alzheimer's disease Sister    Diabetes Brother    Hypertension Brother    Stroke Brother    Cancer Brother     Social History   Tobacco Use   Smoking status: Never   Smokeless tobacco: Never  Substance Use Topics   Alcohol use: Not Currently   Drug use: Never    Home Medications Prior to Admission medications   Medication Sig Start Date End Date Taking? Authorizing Provider  atorvastatin (LIPITOR) 40 MG tablet Take 1 tablet (40 mg total) by mouth at bedtime. 01/28/21   Bensimhon, Bevelyn Buckles, MD  Cholecalciferol (VITAMIN D3 PO) Take 1 tablet by mouth daily in the afternoon.    [provider]  co-enzyme Q-10 30 MG capsule Take 30 mg by mouth daily.    [provider]  ELDERBERRY PO Take 1 capsule by mouth.    [provider]  empagliflozin (JARDIANCE) 10 MG TABS tablet Take 1 tablet (10 mg total) by mouth daily before breakfast. 01/28/21   Bensimhon, Bevelyn Buckles, MD  furosemide (LASIX) 40 MG tablet Take 0.5 tablets (20 mg total) by mouth daily. 02/18/21   Bensimhon, Bevelyn Buckles, MD  isosorbide mononitrate (IMDUR) 30 MG 24 hr tablet Take 1 tablet (30 mg total) by mouth daily. 01/28/21   Bensimhon, Bevelyn Buckles, MD  metoprolol succinate (TOPROL-XL) 50 MG 24 hr tablet Take 1 tablet (50 mg total) by mouth daily. 01/28/21   Bensimhon, Bevelyn Buckles, MD  Multiple Vitamin (MULTIVITAMIN) capsule Take 1 capsule by mouth daily.    [provider]  Omega-3 Fatty Acids (FISH OIL) 1000 MG CAPS Take 1 capsule by mouth 2 (two) times daily.    [provider]  Probiotic Product (PROBIOTIC-10 PO) Take 1 capsule by mouth daily.    [provider]  sacubitril-valsartan (ENTRESTO) 97-103 MG Take 1 tablet by mouth 2 (two) times daily. 03/12/21   Bensimhon, Bevelyn Buckles, MD  spironolactone (ALDACTONE) 25 MG tablet Take 0.5 tablets (12.5 mg total) by mouth daily. 03/02/21   Bensimhon, Bevelyn Buckles, MD  vitamin E 180 MG (  400 UNITS) capsule Take 400 Units by mouth daily.    [provider]    Allergies    Patient has no allergy information on record.  Review of Systems   Review of Systems  Unable to perform ROS: Mental status change   Physical Exam Updated Vital Signs BP 133/75   Pulse 77   Temp 97.6 F (36.4 C) (Oral)   Resp (!) 28   SpO2 94%   Physical Exam Vitals and nursing note reviewed.  85 year old male, resting comfortably and in no acute distress. Vital signs are significant for elevated respiratory rate. Oxygen saturation is 94%, which is normal.  Although tachypneic, he is not using accessory muscles of respiration. Head is normocephalic and  atraumatic. PERRLA, EOMI. Oropharynx is clear. Neck is nontender and supple without adenopathy or JVD. Back is nontender and there is no CVA tenderness. Lungs are clear without rales, wheezes, or rhonchi. Chest is nontender. Heart has regular rate and rhythm without murmur. Abdomen is soft, flat, nontender without masses or hepatosplenomegaly and peristalsis is normoactive. Extremities have no cyanosis or edema, full range of motion is present. Skin is warm and dry without rash. Neurologic: Awake and alert, speech is mildly dysarthric, oriented to person and place but not time, cranial nerves are intact, there are no motor or sensory deficits.  There is no pronator drift.  Station and gait were not tested.  ED Results / Procedures / Treatments   Labs (all labs ordered are listed, but only abnormal results are displayed) Labs Reviewed  RESP PANEL BY RT-PCR (FLU A&B, COVID) ARPGX2 - Abnormal; Notable for the following components:      Result Value   SARS Coronavirus 2 by RT PCR POSITIVE (*)    All other components within normal limits  CBC WITH DIFFERENTIAL/PLATELET - Abnormal; Notable for the following components:   RBC 3.51 (*)    Hemoglobin 11.8 (*)    HCT 35.1 (*)    Lymphs Abs 0.6 (*)    All other components within normal limits  COMPREHENSIVE METABOLIC PANEL - Abnormal; Notable for the following components:   Glucose, Bld 258 (*)    BUN 47 (*)    Creatinine, Ser 2.27 (*)    Total Protein 5.9 (*)    Albumin 2.8 (*)    ALT 45 (*)    GFR, Estimated 28 (*)    All other components within normal limits  LACTIC ACID, PLASMA  LACTIC ACID, PLASMA  URINALYSIS, ROUTINE W REFLEX MICROSCOPIC  APTT  PROTIME-INR    EKG EKG Interpretation  Date/Time:  Wednesday 07-18-21 22:02:00 EDT Ventricular Rate:  65 PR Interval:  182 QRS Duration: 102 QT Interval:  404 QTC Calculation: 420 R Axis:   -32 Text Interpretation: Normal sinus rhythm Left axis deviation Incomplete right  bundle branch block Septal infarct , age undetermined Possible Lateral infarct , age undetermined Abnormal ECG When compared with ECG of 01/28/2021, No significant change was found Confirmed by Dione Booze ([id number]) on 07/09/2021 2:35:36 AM  Radiology DG Chest 2 View  Result Date: 07-18-21 CLINICAL DATA:  COVID positive.  Confusion. EXAM: CHEST - 2 VIEW COMPARISON:  None. FINDINGS: Low lung volumes. Heart size grossly normal. Question of retrocardiac hiatal hernia, not well assessed on the current exam. Coronary artery calcification or stent is seen. There are streaky bibasilar opacities. No pulmonary edema. No pleural effusion. No pneumothorax. Anti lordotic positioning without acute osseous abnormalities. IMPRESSION: Low lung volumes with streaky bibasilar opacities,  likely atelectasis, however pneumonia cannot be excluded in the setting of COVID-19. Electronically Signed   By: Narda Rutherford M.D.   On: 06/18/2021 22:47   CT Head Wo Contrast  Result Date: 07/09/2021 CLINICAL DATA:  Altered mental status. EXAM: CT HEAD WITHOUT CONTRAST TECHNIQUE: Contiguous axial images were obtained from the base of the skull through the vertex without intravenous contrast. COMPARISON:  None. FINDINGS: Brain: There is moderate severity cerebral atrophy with widening of the extra-axial spaces and ventricular dilatation. There are areas of decreased attenuation within the white matter tracts of the supratentorial brain, consistent with microvascular disease changes. Vascular: No hyperdense vessel or unexpected calcification. Skull: Normal. Negative for fracture or focal lesion. Sinuses/Orbits: No acute finding. Other: None. IMPRESSION: 1. Generalized cerebral atrophy. 2. No acute intracranial abnormality. Electronically Signed   By: Aram Candela M.D.   On: 07/09/2021 01:21    Procedures Procedures   Medications Ordered in ED Medications - No data to display  ED Course  I have reviewed the triage vital signs  and the nursing notes.  Pertinent labs & imaging results that were available during my care of the patient were reviewed by me and considered in my medical decision making (see chart for details).   MDM Rules/Calculators/A&P                         Progressive confusion in the setting of COVID-19 infection.  Chest x-ray did not show any obvious infiltrates.  CT of head showed generalized atrophy, no acute finding.  Labs do show acute kidney injury with creatinine 2.27 compared with 1.81 on 04/01/2021.  Also, there has been a slight drop in hemoglobin from 12.8 on 01/15/2019 to 11.8 today.  Respiratory pathogen panel is positive for COVID-19.  At this point, I do not see any evidence of stroke.  However, he is quite tachypneic to be maintaining a relatively low oxygen saturation.  I feel that he needs admission for further evaluation of his mental status change.  Urinalysis is ordered.  He may need an MRI for further evaluation.  He is outside of the timeframe where antiviral medications would be helpful.  Of note, he is unvaccinated.  Case is discussed with Dr. Nobie Putnam of family practice teaching service, who agrees to admit the patient.  Final Clinical Impression(s) / ED Diagnoses Final diagnoses:  Confusion  COVID-19 virus infection  Acute kidney injury (nontraumatic) (HCC)  Normochromic normocytic anemia    Rx / DC Orders ED Discharge Orders     None        Dione Booze, MD 07/09/21 2251

## 2021-07-09 NOTE — ED Notes (Signed)
2mg  Versed, 20 mg Etomidate given at this time. Intubation started at 1557.

## 2021-07-09 NOTE — ED Notes (Signed)
Pt was found picking on his arms by staff. Also, pt was found removing EKG leads and pulse O2. Pt unable to sit still on bed. Staff left pt for 5 minutes and was found on the floor. MD notified at this time. Spoke to admit team who states he is going to contact son as pt was calm when son was at bedside.

## 2021-07-09 NOTE — ED Notes (Signed)
Admitting paged to RN per her request 

## 2021-07-09 NOTE — Progress Notes (Signed)
Was called by the patient's nurse that the patient had become unresponsive.  Immediately went to the patient's room.  Found the patient's son sitting beside the patient in the room.  Patient was diaphoretic and unresponsive to both speech and sternal rub.  Patient with nonrebreather in place, satting mid 90s.  Per the patient's son the patient had taken a nap and when the son shook him to wake him up the patient did not respond and so he grabbed a Engineer, civil (consulting).  Patient's left pupil is nonreactive as documented in the H&P with right pupil reactive.  Patient was using substantial accessory muscles for breathing with the nonrebreather in place.  Stat EKG, troponin, CBC/CMP/VBG were ordered and CCM was consulted.  CCM immediately came and were able to intubate the patient successfully.  Greatly appreciate the assistance and expertise of our CCM team with the care of this patient.

## 2021-07-09 NOTE — ED Notes (Signed)
Pt is starting to move his arms at this time. Fentanyl drip initiated for sedation. Will continue to monitor.

## 2021-07-09 NOTE — Progress Notes (Signed)
eLink Physician-Brief Progress Note Patient Name: Tristan Hart DOB: 1935-12-24 MRN: [id number]   Date of Service  07/09/2021  HPI/Events of Note  Multiple isssues: 1. Hypotension - BP = 70/48 with MAP = 54. No CVP or CVL. LVEF = 25-30%. 2. Nursing notes that pupils are not equal. Head CT Scan at 4:21 PM today no evidence for parenchymal hemorrhage, mass lesion, mass effect, acute infarction or midline shift. An MRI is ordered and pending.  eICU Interventions  Plan: Bolus with 0.9 NaCl 500 mL IV over 30 minutes now.  Phenylephrine IV infusion via PIV. Titrate to MAP >= 65.     Intervention Category Major Interventions: Hypotension - evaluation and management;Change in mental status - evaluation and management  Janalee Grobe Dennard Nip 07/09/2021, 9:10 PM

## 2021-07-09 NOTE — ED Notes (Signed)
50mg Rocuronium given at this time.

## 2021-07-09 NOTE — ED Notes (Signed)
Pt got out of restraints at this time. Pt situated on bed. Cleaned and changed into a new condom cath. Pt pullout out previous condom cath. Pt was also picking on the sheets and pulled out the call bell. MD paged to order 4 pt restraints at this time for safety.

## 2021-07-09 NOTE — Progress Notes (Signed)
PT Cancellation Note  Patient Details Name: Tristan Hart MRN: [id number] DOB: August 22, 1936   Cancelled Treatment:    Reason Eval/Treat Not Completed: Patient not medically ready. Pt had recent fall, is on restraints, RN requested we hold therapy. We will follow-up as patient appropriate and as schedule allows.   [name], SPT  [name] 07/09/2021, 11:09 AM

## 2021-07-09 NOTE — H&P (Signed)
Family Medicine Teaching [facility] Admission History and Physical Service Pager: [phone]  Patient name: Tristan Hart Medical record number: [id number] Date of birth: [birth year] Age: 85 y.o. Gender: male  Primary Care Provider: Littie Deeds, MD Consultants: None  Code Status: which was confirmed with family if patient unable to confirm  Preferred Emergency Contact: Young Berry [phone]  Chief Complaint: Altered mental status  Assessment and Plan: Tristan Hart is a 85 y.o. male presenting with altered mental status. PMH is significant for hyperlipidemia, HFrEF, primary hypertension, prediabetes, CKD stage IIIb.  AMS Per patient's son the patient has been having intermittent altered mental status for the last 3 days.  He has also had difficulty with balance.  Patient's son reported that he had 1 episode of facial droop and that he fell yesterday and hit his hand as well as head.  On arrival to the emergency department he is oriented to person but not place or time.  CT head showed generalized cerebral atrophy but no acute intracranial abnormalities.  Physical exam was reassuring with no gross focal neurologic deficits, no decreased strength.  Unclear etiology for the altered mental status.  Intracranial abnormality not extremely likely given normal CT head.  Substance use also not likely as son reports the patient lives with him and denies any alcohol or drug use.  This is a delirium of some form and may be related to the COVID-19 infection.  Update after initial evaluation: Was called to bedside after patient became agitated.  He was found in the floor.  His son left to go change clothes and shower.  Soft restraints were applied for patient safety, called son and discussed this with the patient's son and he was agreeable to this. - Admit to inpatient teaching service with Dr. Colin Ina attending - Delirium precautions - Fall precautions - Consider MRI brain if no improvement or  worsening of symptoms - Allow family member to stay extended hours - PT/OT eval and treat - Vitals per floor routine - Strict I's and O's -Continue soft wrist restraints until patient son arrives and then please remove  HFrEF Followed by heart failure team with Dr. Gala Romney.  Most recent echo was 02/18/2021 and showed EF of 25-30 %.  Regional wall motion abnormalities seen.  Moderate dilation of the left ventricle.  Grade 2 diastolic dysfunction noted.  Physical exam shows +1 pitting edema to the knees as well as bibasilar crackles. - BNP ordered and we will follow-up on this. - Monitor daily weights - Strict I's and O's - May require diuresis but will closely monitor - Continue home medications Lasix, Imdur, metoprolol, Entresto, Aldactone.  COVID-19 infection Patient tested positive for COVID-19 with an at home test on 7/20.  Patient has not complained of shortness of breath and on arrival to the emergency department was tachypneic but not hypoxic.  On evaluation patient is on 2 L nasal cannula and satting 99%.  When discontinued remained at 97%.  Chest x-ray showed low lung volumes with possible atelectasis, cannot rule out pneumonia in the setting of COVID-19. -We will monitor O2 sats with continuous pulse ox. -Given no desaturation.  Will hold off on remdesivir and Decadron at this time  CKD stage IIIb Baseline creatinine appears to be around 1.8.  On arrival creatinine of 2.27.  Unsure of the exact etiology of bump in creatinine.  This could be a worsening of his baseline versus cardiorenal syndrome versus some yet unidentified cause. - Daily BMPs to monitor kidney function - Continuing  diuretics at this time but will adjust if needed.  HTN Blood pressure since arrival have ranged from 115/63-142/91. - Continue home medications - Vital signs per routine  Prediabetes Patient's last hemoglobin A1c was 5.9.  Currently on Jardiance twice daily.  Holding Jardiance at this time but can  consider restarting   FEN/GI: Heart healthy carb modified Prophylaxis: Lovenox  Disposition: Admit to inpatient teaching service  History of Present Illness:  Tristan Hart is a 85 y.o. male presenting with altered mental status.  Patient's son provides history.  Patient was positive for COVID-19 last Wednesday.  Reports that he had minor symptoms over the last week with 1 fever.  Over the last 3 days he has had issues with his cognition and balance has gotten worse.  On occasion he is asked where he was or "visit time to go home".  Yesterday he stumbled and fell and scraped his hand so the son decided it was time to bring him in for evaluation.  Reports that he also noted on one occasion possible facial droop but it resolved.  The patient's altered mental status and difficulty ambulating comes and goes and the patient son cannot of 10 to find any triggering factors.  He does report that his father has been sleeping more than normal recently.  Reports that his father's baseline is completely oriented and alert.  He reports over the last few years he has become "a little forgetful" but no problems with orientation.  Reports that the patient lost 2 siblings to COVID-19 over the past 2 years.  Review Of Systems: Per HPI with the following additions:   Review of Systems  Constitutional:  Positive for activity change and appetite change. Negative for chills and fever.  HENT:  Positive for rhinorrhea and sore throat. Negative for congestion.   Eyes:  Negative for visual disturbance.  Respiratory:  Positive for cough and shortness of breath.   Cardiovascular:  Negative for chest pain and palpitations.  Gastrointestinal:  Negative for abdominal pain, nausea and vomiting.    Patient Active Problem List   Diagnosis Date Noted   Altered mental status 07/09/2021   CKD stage G3b/A3, GFR 30-44 and albumin creatinine ratio >300 mg/g (HCC) 01/06/2021   Hyperlipidemia 12/10/2020   HFrEF (heart failure  with reduced ejection fraction) (HCC) 12/10/2020   Primary hypertension 12/10/2020   Pre-diabetes 12/10/2020   Depressed mood 12/10/2020    Past Medical History: Past Medical History:  Diagnosis Date   Congestive heart failure (CHF) (HCC)    Hyperlipidemia    Hypertension     Past Surgical History: No past surgical history on file.  Social History: Social History   Tobacco Use   Smoking status: Never   Smokeless tobacco: Never  Substance Use Topics   Alcohol use: Not Currently   Drug use: Never   Please also refer to relevant sections of EMR.  Family History: Family History  Problem Relation Age of Onset   Diabetes Mother    Hypertension Mother    Alzheimer's disease Sister    Diabetes Brother    Hypertension Brother    Stroke Brother    Cancer Brother      Allergies and Medications: Not on File No current facility-administered medications on file prior to encounter.   Current Outpatient Medications on File Prior to Encounter  Medication Sig Dispense Refill   atorvastatin (LIPITOR) 40 MG tablet Take 1 tablet (40 mg total) by mouth at bedtime. 90 tablet 2   Cholecalciferol (VITAMIN  D3 PO) Take 1 tablet by mouth daily in the afternoon.     co-enzyme Q-10 30 MG capsule Take 30 mg by mouth daily.     ELDERBERRY PO Take 1 capsule by mouth.     empagliflozin (JARDIANCE) 10 MG TABS tablet Take 1 tablet (10 mg total) by mouth daily before breakfast. 90 tablet 2   furosemide (LASIX) 40 MG tablet Take 0.5 tablets (20 mg total) by mouth daily. 45 tablet 2   isosorbide mononitrate (IMDUR) 30 MG 24 hr tablet Take 1 tablet (30 mg total) by mouth daily. 90 tablet 2   metoprolol succinate (TOPROL-XL) 50 MG 24 hr tablet Take 1 tablet (50 mg total) by mouth daily. 90 tablet 2   Multiple Vitamin (MULTIVITAMIN) capsule Take 1 capsule by mouth daily.     Omega-3 Fatty Acids (FISH OIL) 1000 MG CAPS Take 1 capsule by mouth 2 (two) times daily.     Probiotic Product (PROBIOTIC-10  PO) Take 1 capsule by mouth daily.     sacubitril-valsartan (ENTRESTO) 97-103 MG Take 1 tablet by mouth 2 (two) times daily. 180 tablet 3   spironolactone (ALDACTONE) 25 MG tablet Take 0.5 tablets (12.5 mg total) by mouth daily. 45 tablet 2   vitamin E 180 MG (400 UNITS) capsule Take 400 Units by mouth daily.      Objective: BP 129/68   Pulse 71   Temp 97.6 F (36.4 C) (Oral)   Resp (!) 21   SpO2 92%  Exam: Physical Exam Vitals and nursing note reviewed.  Constitutional:      General: He is not in acute distress.    Appearance: He is not toxic-appearing.  HENT:     Head: Normocephalic and atraumatic.     Right Ear: External ear normal.     Left Ear: External ear normal.     Nose: Rhinorrhea present. No congestion.     Mouth/Throat:     Mouth: Mucous membranes are moist.     Pharynx: No oropharyngeal exudate.  Eyes:     Conjunctiva/sclera: Conjunctivae normal.     Comments: Extraocular eye movement is intact.  Left pupil does not constrict or dilate.  Right pupil constricts and dilates appropriately.  Cardiovascular:     Rate and Rhythm: Normal rate and regular rhythm.     Pulses: Normal pulses.  Pulmonary:     Effort: Pulmonary effort is normal.     Comments: Patient has crackles in bilateral lung bases. Abdominal:     General: Bowel sounds are normal.     Tenderness: There is no abdominal tenderness.  Musculoskeletal:        General: Signs of injury (Bandaged abrasion noted on the right hand.  No tenderness to palpation.) present. Normal range of motion.     Cervical back: Normal range of motion and neck supple. No rigidity or tenderness.  Skin:    Capillary Refill: Capillary refill takes less than 2 seconds.  Neurological:     Mental Status: He is disoriented.     Cranial Nerves: No cranial nerve deficit.     Sensory: No sensory deficit.     Motor: No weakness.     Comments: Patient is oriented to person.  When asked place he reports the hospital.  When asked the  city he reports [facility] [place].  When asked the time he says February 2003.     Labs and Imaging: CBC BMET  Recent Labs  Lab 08/05/21 2236  WBC 6.0  HGB 11.8*  HCT 35.1*  PLT 319   Recent Labs  Lab 06/14/2021 2236  NA 137  K 4.1  CL 106  CO2 24  BUN 47*  CREATININE 2.27*  GLUCOSE 258*  CALCIUM 9.4     EKG: My own interpretation Normal sinus rhythm with left axis deviation.  Unchanged from EKG on 01/28/2021. DG Chest 2 View  Result Date: 07/09/2021 CLINICAL DATA:  COVID positive.  Confusion. EXAM: CHEST - 2 VIEW COMPARISON:  None. FINDINGS: Low lung volumes. Heart size grossly normal. Question of retrocardiac hiatal hernia, not well assessed on the current exam. Coronary artery calcification or stent is seen. There are streaky bibasilar opacities. No pulmonary edema. No pleural effusion. No pneumothorax. Anti lordotic positioning without acute osseous abnormalities. IMPRESSION: Low lung volumes with streaky bibasilar opacities, likely atelectasis, however pneumonia cannot be excluded in the setting of COVID-19. Electronically Signed   By: Narda RutherfordMelanie  Sanford M.D.   On: 06/20/2021 22:47   CT Head Wo Contrast  Result Date: 07/09/2021 CLINICAL DATA:  Altered mental status. EXAM: CT HEAD WITHOUT CONTRAST TECHNIQUE: Contiguous axial images were obtained from the base of the skull through the vertex without intravenous contrast. COMPARISON:  None. FINDINGS: Brain: There is moderate severity cerebral atrophy with widening of the extra-axial spaces and ventricular dilatation. There are areas of decreased attenuation within the white matter tracts of the supratentorial brain, consistent with microvascular disease changes. Vascular: No hyperdense vessel or unexpected calcification. Skull: Normal. Negative for fracture or focal lesion. Sinuses/Orbits: No acute finding. Other: None. IMPRESSION: 1. Generalized cerebral atrophy. 2. No acute intracranial abnormality. Electronically Signed   By: Aram Candelahaddeus   Houston M.D.   On: 07/09/2021 01:21     Derrel Nipresenzo, Victor, MD 07/09/2021, 8:03 AM PGY-3, [place] [facility] FPTS Intern pager: [phone], text pages welcome

## 2021-07-09 NOTE — ED Notes (Signed)
CBG 247 

## 2021-07-09 NOTE — ED Notes (Addendum)
Pt son came to the nursing station and told RN he cannot awaken pt. Pt was diaphoretic and unresponsive at this time. Admit team paged stat. Blood sugar was 247. Pt son states pt had a few sips of water before going to sleep about 20-46minutes after he arrived in the room. Pt was still constantly moving on bed and talking incoherently to son. Currently unresponsive to sternal rub. Family medicine paged stat and is currently at bedside. Pt switched to non rebreather. RT at bedside. Repeat labs drawn and abg. Pt has noted to have bilateral unequal pupils which is not new. Pt is diaphoretic and completely unresponsive. Stable VS noted on the monitor. Will continue to monitor.

## 2021-07-09 NOTE — Progress Notes (Signed)
Pt transported from ED 19 to 3M14 with no complications.

## 2021-07-09 NOTE — ED Notes (Signed)
Preparing for intubation at this time. Family medicine team at bedside.

## 2021-07-09 NOTE — Progress Notes (Signed)
Paged eLink to notify them of unequal pupils  Right eye 4, non-reactive  Left eye 2, brisk  Will wait for call back from eLink for further instructions  Pt is also being transported for pending MRI.

## 2021-07-09 NOTE — ED Notes (Signed)
Pt restraints readjusted as pt constantly attempts to break out of restraints. Pulled the call bell cord and removed 2 ekg wires. Pt is pleasantly confused. Hx of dementia. Per MD, pt son is coming back after lunch to take care of pt.

## 2021-07-09 NOTE — ED Notes (Signed)
Admit team paged at this time for additional sedation orders.

## 2021-07-09 NOTE — ED Notes (Signed)
Bedside report received. Pt currently at maximum fentanyl dosage, but small movements are noted. MD messaged and paged regarding further sedation instructions.

## 2021-07-09 NOTE — Progress Notes (Signed)
Went to evaluate patient after nurse notification that patient was becoming more agitated and had pulled out his condom cath.  We had given the okay to place patient four-point restraints as I was heading down to see the patient for patient safety purposes.  Found the patient in four-point restraints attempting to get out of bed but he was safely secured in bed.  Patient became calm when I spoke with him.  Patient was alert and oriented only to self stating that he was in [place] and it was 69.  Per the nurse the patient's son should be back early this afternoon and per the nurse the patient was not agitated when the son was in the room earlier.  For patient's safety we will keep four-point restraints in place at this time as we do not want to risk him having a fall as he has been attempting to get out of bed.  Once the son arrives we can attempt to de-escalate restraints according to what is needed to continue patient's safety.  Dr. Atha Starks

## 2021-07-09 NOTE — ED Notes (Addendum)
PA attempted to notify pt family again, able to speak with family and requested they stay with the patient. Family also reporting that the patient did have a fall tonight (prior to coming to the ED) on the steps and hit his head at that time (abrasion to the back of the head from then) unknown tetanus. Aware to notify staff if he is unable to stay with the patient.

## 2021-07-09 NOTE — ED Notes (Signed)
Admit team aware of pt fall. Pt was found sitting on the floor at the end of the bed. Pt states he hit his arms and possibly his head. No obvious deformities noted. Pt remains alert and oriented to self only. Pt has a skin tear to right hand noted from previously and not from the fall. Also noted to have a skin tear to left arm almost to the elbow area where pt was picking on to arms. Will continue to monitor.

## 2021-07-09 NOTE — ED Notes (Signed)
2LPM initiated for comfort. Pt RR was 30-35 on RA.

## 2021-07-09 NOTE — Progress Notes (Signed)
eLink Physician-Brief Progress Note Patient Name: Tristan Hart DOB: 01-May-1936 MRN: [id number]   Date of Service  07/09/2021  HPI/Events of Note  Troponin = 164.  eICU Interventions  Plan: ASA 81 mg per tube now and Q day, Continue to trend Troponin.     Intervention Category Major Interventions: Other:  [name] 07/09/2021, 10:06 PM

## 2021-07-10 ENCOUNTER — Other Ambulatory Visit (HOSPITAL_COMMUNITY): Payer: Medicare HMO

## 2021-07-10 ENCOUNTER — Inpatient Hospital Stay (HOSPITAL_COMMUNITY): Payer: Medicare HMO

## 2021-07-10 DIAGNOSIS — J9601 Acute respiratory failure with hypoxia: Secondary | ICD-10-CM

## 2021-07-10 DIAGNOSIS — I639 Cerebral infarction, unspecified: Secondary | ICD-10-CM | POA: Diagnosis not present

## 2021-07-10 DIAGNOSIS — R579 Shock, unspecified: Secondary | ICD-10-CM | POA: Diagnosis not present

## 2021-07-10 DIAGNOSIS — N179 Acute kidney failure, unspecified: Secondary | ICD-10-CM | POA: Diagnosis not present

## 2021-07-10 LAB — COMPREHENSIVE METABOLIC PANEL
ALT: 37 U/L (ref 0–44)
AST: 31 U/L (ref 15–41)
Albumin: 2.7 g/dL — ABNORMAL LOW (ref 3.5–5.0)
Alkaline Phosphatase: 72 U/L (ref 38–126)
Anion gap: 9 (ref 5–15)
BUN: 41 mg/dL — ABNORMAL HIGH (ref 8–23)
CO2: 23 mmol/L (ref 22–32)
Calcium: 9.2 mg/dL (ref 8.9–10.3)
Chloride: 111 mmol/L (ref 98–111)
Creatinine, Ser: 2.02 mg/dL — ABNORMAL HIGH (ref 0.61–1.24)
GFR, Estimated: 32 mL/min — ABNORMAL LOW (ref >60)
Glucose, Bld: 140 mg/dL — ABNORMAL HIGH (ref 70–99)
Potassium: 4.5 mmol/L (ref 3.5–5.1)
Sodium: 143 mmol/L (ref 135–145)
Total Bilirubin: 1 mg/dL (ref 0.3–1.2)
Total Protein: 5.7 g/dL — ABNORMAL LOW (ref 6.5–8.1)

## 2021-07-10 LAB — POCT I-STAT 7, (LYTES, BLD GAS, ICA,H+H)
Acid-base deficit: 4 mmol/L — ABNORMAL HIGH (ref 0.0–2.0)
Bicarbonate: 22.2 mmol/L (ref 20.0–28.0)
Calcium, Ion: 1.35 mmol/L (ref 1.15–1.40)
HCT: 36 % — ABNORMAL LOW (ref 39.0–52.0)
Hemoglobin: 12.2 g/dL — ABNORMAL LOW (ref 13.0–17.0)
O2 Saturation: 94 %
Potassium: 3.7 mmol/L (ref 3.5–5.1)
Sodium: 145 mmol/L (ref 135–145)
TCO2: 24 mmol/L (ref 22–32)
pCO2 arterial: 43.2 mmHg (ref 32.0–48.0)
pH, Arterial: 7.319 — ABNORMAL LOW (ref 7.350–7.450)
pO2, Arterial: 78 mmHg — ABNORMAL LOW (ref 83.0–108.0)

## 2021-07-10 LAB — PROCALCITONIN: Procalcitonin: 1.12 ng/mL

## 2021-07-10 LAB — TROPONIN I (HIGH SENSITIVITY)
Troponin I (High Sensitivity): 531 ng/L (ref <18)
Troponin I (High Sensitivity): 688 ng/L (ref <18)

## 2021-07-10 LAB — GLUCOSE, CAPILLARY
Glucose-Capillary: 107 mg/dL — ABNORMAL HIGH (ref 70–99)
Glucose-Capillary: 119 mg/dL — ABNORMAL HIGH (ref 70–99)
Glucose-Capillary: 123 mg/dL — ABNORMAL HIGH (ref 70–99)
Glucose-Capillary: 156 mg/dL — ABNORMAL HIGH (ref 70–99)
Glucose-Capillary: 173 mg/dL — ABNORMAL HIGH (ref 70–99)
Glucose-Capillary: 207 mg/dL — ABNORMAL HIGH (ref 70–99)
Glucose-Capillary: 96 mg/dL (ref 70–99)

## 2021-07-10 LAB — HEMOGLOBIN A1C
Hgb A1c MFr Bld: 8 % — ABNORMAL HIGH (ref 4.8–5.6)
Mean Plasma Glucose: 182.9 mg/dL

## 2021-07-10 LAB — MRSA NEXT GEN BY PCR, NASAL: MRSA by PCR Next Gen: NOT DETECTED

## 2021-07-10 LAB — CBC
HCT: 42.2 % (ref 39.0–52.0)
Hemoglobin: 14 g/dL (ref 13.0–17.0)
MCH: 33 pg (ref 26.0–34.0)
MCHC: 33.2 g/dL (ref 30.0–36.0)
MCV: 99.5 fL (ref 80.0–100.0)
Platelets: 283 10*3/uL (ref 150–400)
RBC: 4.24 MIL/uL (ref 4.22–5.81)
RDW: 14.3 % (ref 11.5–15.5)
WBC: 14.7 10*3/uL — ABNORMAL HIGH (ref 4.0–10.5)
nRBC: 0 % (ref 0.0–0.2)

## 2021-07-10 LAB — MAGNESIUM
Magnesium: 1.9 mg/dL (ref 1.7–2.4)
Magnesium: 1.9 mg/dL (ref 1.7–2.4)

## 2021-07-10 LAB — PHOSPHORUS: Phosphorus: 2.9 mg/dL (ref 2.5–4.6)

## 2021-07-10 IMAGING — DX DG CHEST 1V PORT
1 series · 1 of 1 positions shown · non-contrast
Comparison: Prior chest x-ray yesterday [DATE]

CLINICAL DATA: Pneumonia

EXAM:
PORTABLE CHEST 1 VIEW

[chest]
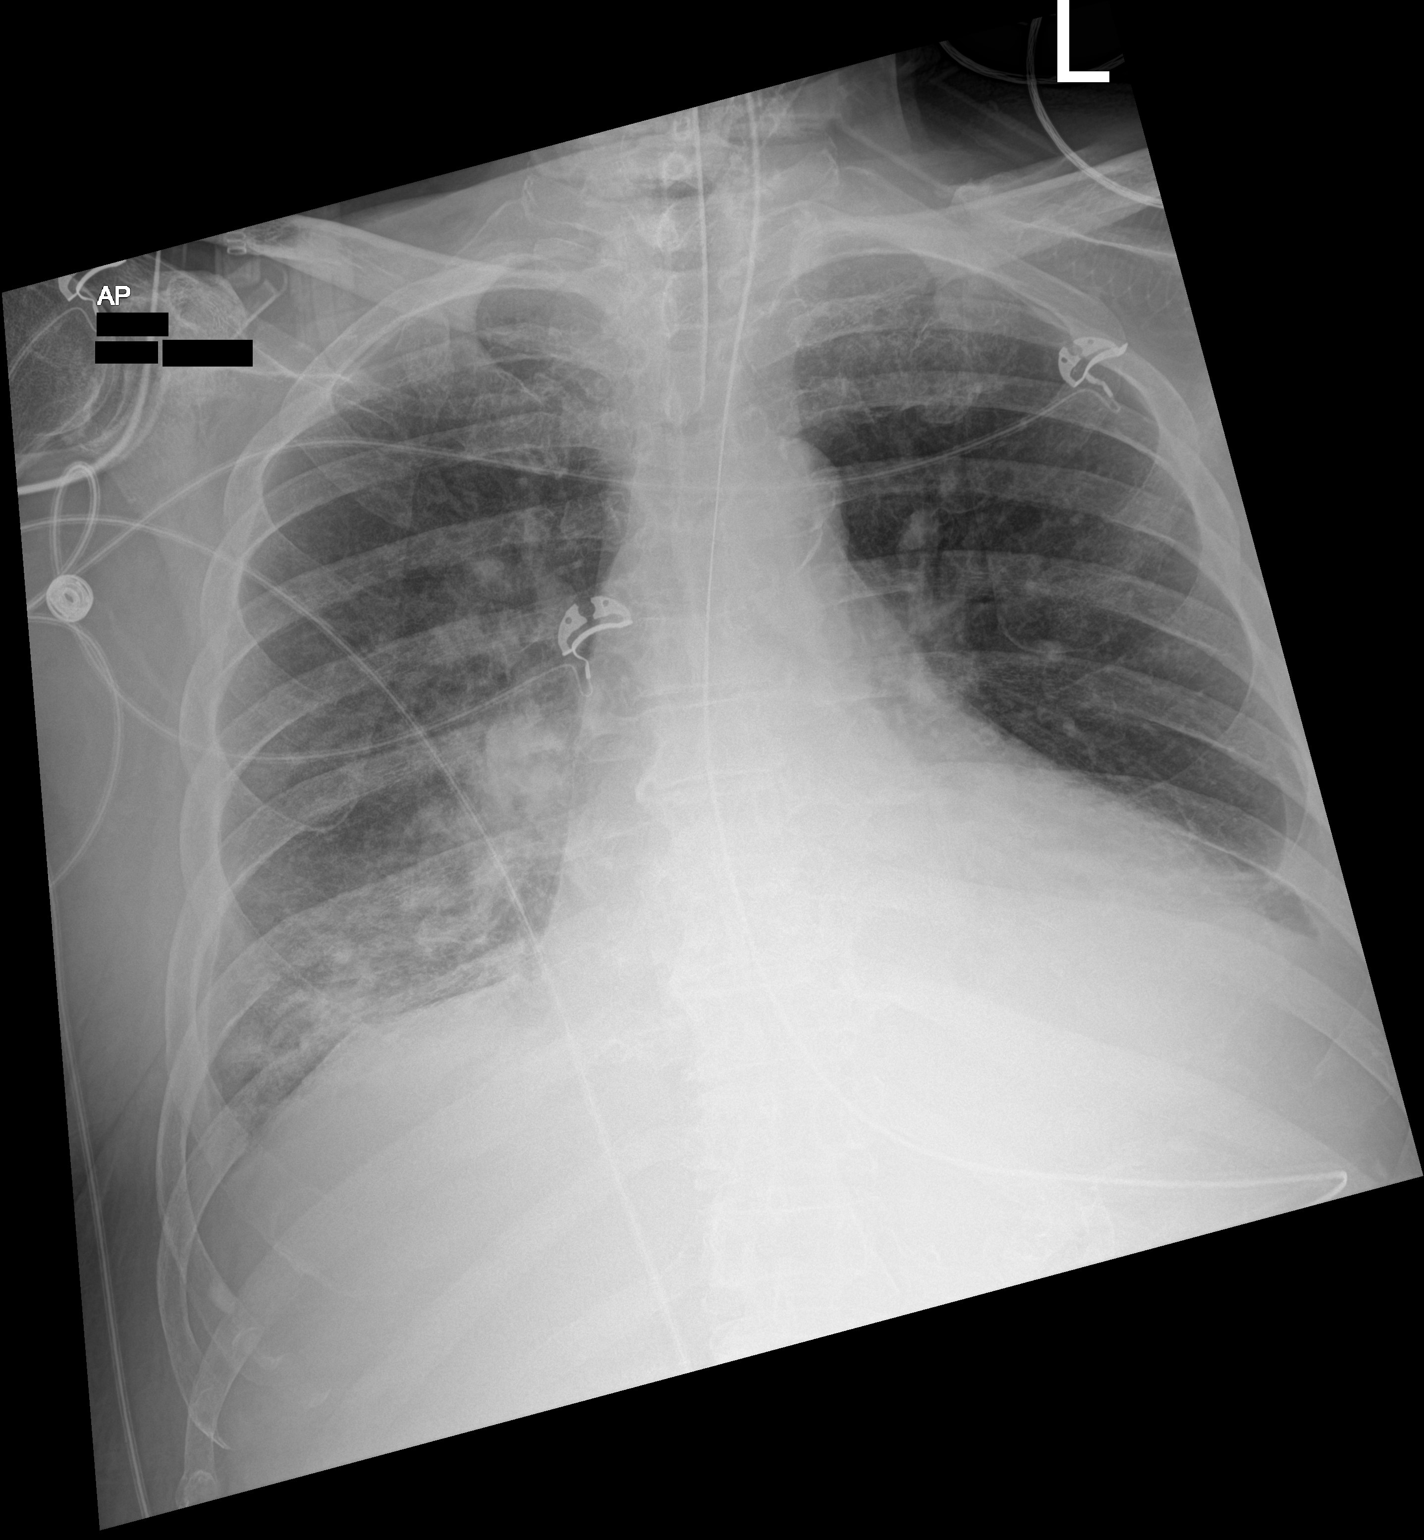

[1 of 1 positions shown; findings below may reference images not displayed]

FINDINGS: The patient is intubated. The tip of the endotracheal tube is 4.9 cm
above the carina. A gastric tube is present. The tip is not
visualized and lies off the field of view but is almost certainly
within the stomach. Stable cardiac and mediastinal contours.
Atherosclerotic calcifications in the transverse aorta.

Patchy airspace opacities present in the right mid and lower lung.
Additionally, there is obscuration of the left hemidiaphragm
consistent with left lower lobe airspace disease. Mild pulmonary
vascular congestion without overt edema. Small bilateral pleural
effusions. No pneumothorax.
IMPRESSION: 1. The tip of the endotracheal tube is 4.9 cm above the carina.
2. G-tube with the tip nonvisualized but below the diaphragm and
likely in the stomach.
3. Small bilateral pleural effusions with associated lower lobe
atelectasis.
4. Additional patchy airspace opacities in the right mid lung and
both lung bases may reflect atelectasis or infiltrate given clinical
history of pneumonia.

## 2021-07-10 MED ORDER — SODIUM CHLORIDE 0.9 % IV SOLN
3.0000 g | Freq: Two times a day (BID) | INTRAVENOUS | Status: DC
  Administered 2021-07-10 – 2021-07-11 (×3): 3 g via INTRAVENOUS
  Filled 2021-07-10 (×3): qty 8

## 2021-07-10 MED ORDER — MAGNESIUM SULFATE IN D5W 1-5 GM/100ML-% IV SOLN
1.0000 g | Freq: Once | INTRAVENOUS | Status: AC
  Administered 2021-07-10: 1 g via INTRAVENOUS
  Filled 2021-07-10: qty 100

## 2021-07-10 MED ORDER — SODIUM CHLORIDE 0.9 % IV SOLN
250.0000 mL | INTRAVENOUS | Status: DC
  Administered 2021-07-10: 250 mL via INTRAVENOUS

## 2021-07-10 MED ORDER — SODIUM CHLORIDE 0.9 % IV BOLUS
500.0000 mL | Freq: Once | INTRAVENOUS | Status: AC
  Administered 2021-07-10: 500 mL via INTRAVENOUS

## 2021-07-10 MED ORDER — ACETAMINOPHEN 160 MG/5ML PO SOLN
650.0000 mg | ORAL | Status: DC | PRN
  Administered 2021-07-10 – 2021-07-13 (×2): 650 mg
  Filled 2021-07-10 (×2): qty 20.3

## 2021-07-10 MED ORDER — NOREPINEPHRINE 4 MG/250ML-% IV SOLN: 0.0000 ug/min | INTRAVENOUS | Status: DC

## 2021-07-10 MED ORDER — VITAL AF 1.2 CAL PO LIQD
1000.0000 mL | ORAL | Status: DC
  Administered 2021-07-10 – 2021-07-13 (×3): 1000 mL

## 2021-07-10 MED ORDER — INSULIN ASPART 100 UNIT/ML IJ SOLN
0.0000 [IU] | INTRAMUSCULAR | Status: DC
  Administered 2021-07-10: 2 [IU] via SUBCUTANEOUS
  Administered 2021-07-10 – 2021-07-11 (×5): 3 [IU] via SUBCUTANEOUS
  Administered 2021-07-11: 5 [IU] via SUBCUTANEOUS
  Administered 2021-07-11 – 2021-07-12 (×2): 3 [IU] via SUBCUTANEOUS
  Administered 2021-07-12: 2 [IU] via SUBCUTANEOUS
  Administered 2021-07-12 – 2021-07-13 (×5): 3 [IU] via SUBCUTANEOUS
  Administered 2021-07-13 (×2): 2 [IU] via SUBCUTANEOUS
  Administered 2021-07-13 – 2021-07-14 (×4): 3 [IU] via SUBCUTANEOUS
  Administered 2021-07-14: 5 [IU] via SUBCUTANEOUS
  Administered 2021-07-14: 3 [IU] via SUBCUTANEOUS

## 2021-07-10 MED ORDER — HEPARIN BOLUS VIA INFUSION
2000.0000 [IU] | Freq: Once | INTRAVENOUS | Status: DC
  Filled 2021-07-10: qty 2000

## 2021-07-10 MED ORDER — POTASSIUM CHLORIDE 20 MEQ PO PACK
40.0000 meq | PACK | Freq: Once | ORAL | Status: AC
  Administered 2021-07-10: 40 meq
  Filled 2021-07-10: qty 2

## 2021-07-10 MED ORDER — HEPARIN (PORCINE) 25000 UT/250ML-% IV SOLN: 1000.0000 [IU]/h | INTRAVENOUS | Status: DC

## 2021-07-10 MED ORDER — ATORVASTATIN CALCIUM 40 MG PO TABS
40.0000 mg | ORAL_TABLET | Freq: Every day | ORAL | Status: DC
  Administered 2021-07-10: 40 mg
  Filled 2021-07-10: qty 1

## 2021-07-10 MED ORDER — NOREPINEPHRINE 4 MG/250ML-% IV SOLN
2.0000 ug/min | INTRAVENOUS | Status: DC
  Administered 2021-07-10: 4 ug/min via INTRAVENOUS
  Administered 2021-07-11 (×4): 10 ug/min via INTRAVENOUS
  Administered 2021-07-12: 4 ug/min via INTRAVENOUS
  Administered 2021-07-12 – 2021-07-14 (×5): 8 ug/min via INTRAVENOUS
  Filled 2021-07-10 (×13): qty 250

## 2021-07-10 NOTE — Consult Note (Signed)
NEURO HOSPITALIST CONSULT NOTE   Requestig physician: Dr. Erin Fulling, J.   Reason for Consult: Left MCA stroke on MRI brain.   History obtained from:  Chart   HPI:                                                                                                                                          Tristan Hart is an 85 y.o. 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 His son had noticed patient to be confused with gait abnormality and an on and off facial droop x 3 days. Patient was also more sleepy than normal for him. Patient tested + for COVID infection on 07/01/21 and is still + here.   Workup in the ED included CTH showing generalized cerebral atrophy but no acute finding. Yesterday, patient became agitated and was found on the ED floor. No noted injury. However, around 1600 hrs yesterday, the son shook the patient to wake him and the patient was unresponsive with compromised airway. PCCM was called and patient was intubated and transferred to ICU. Per PCCM NP, patient has been on pressors, but no cause for shock has been discovered as of yet. Thoughts are it could be secondary to COVID infection. Patient's troponin level elevated and ASA $Remov'81mg'CWJfpH$  qd was prescribed. Due to suspicion for ACS, Heparin was started but discontinued upon MRI brain results. Patient also felt to have CHF decompensation, NSVT run, hypomagnesia, shock, AKI on CKD III, and hypotension. Discussed hypotension with PCCM NP. Goal of 120-140 given stroke with hemorrhage. No need for permissive HTN, but avoid BP below parameters due to new stroke finding.   Neurology was asked to consult due to new stroke finding on MRI brain with noted fairly large evolving acute ischemic left MCA distribution and associated petechial hemorrhage with intraluminal thrombus within the distal left MCA branches, possible LICA occlusion and small  remote lacunar infarcts in bilateral basal ganglia and left thalamus.   Past Medical History:  Diagnosis Date   Congestive heart failure (CHF) ([place])    Hyperlipidemia    Hypertension   CKD IIIb  No past surgical history on file.  Family History  Problem Relation Age of Onset   Diabetes Mother    Hypertension Mother    Alzheimer's disease Sister    Diabetes Brother    Hypertension Brother    Stroke Brother    Cancer Brother              Social History:  reports that he has never smoked. He has never used smokeless tobacco. He reports previous alcohol use. He reports that he does not use drugs.  MEDICATIONS:  Prior to Admission:  Medications Prior to Admission  Medication Sig Dispense Refill Last Dose   atorvastatin (LIPITOR) 40 MG tablet Take 1 tablet (40 mg total) by mouth at bedtime. 90 tablet 2 07/04/2021   Cholecalciferol (VITAMIN D3 PO) Take 1 tablet by mouth daily in the afternoon.   07/07/2021   co-enzyme Q-10 30 MG capsule Take 30 mg by mouth daily.   07/12/2021   ELDERBERRY PO Take 1 capsule by mouth daily.   06/29/2021   empagliflozin (JARDIANCE) 10 MG TABS tablet Take 1 tablet (10 mg total) by mouth daily before breakfast. 90 tablet 2 07/12/2021   furosemide (LASIX) 40 MG tablet Take 0.5 tablets (20 mg total) by mouth daily. 45 tablet 2 06/19/2021   glipiZIDE (GLUCOTROL) 5 MG tablet Take 5 mg by mouth daily.   06/16/2021   isosorbide mononitrate (IMDUR) 30 MG 24 hr tablet Take 1 tablet (30 mg total) by mouth daily. 90 tablet 2 06/26/2021   metoprolol succinate (TOPROL-XL) 50 MG 24 hr tablet Take 1 tablet (50 mg total) by mouth daily. 90 tablet 2 07/07/2021 at 8am   Multiple Vitamin (MULTIVITAMIN) capsule Take 1 capsule by mouth daily.   06/21/2021   Omega-3 Fatty Acids (FISH OIL) 1000 MG CAPS Take 1 capsule by mouth 2 (two) times daily.   06/26/2021   Probiotic  Product (PROBIOTIC-10 PO) Take 1 capsule by mouth daily.   06/25/2021   sacubitril-valsartan (ENTRESTO) 97-103 MG Take 1 tablet by mouth 2 (two) times daily. 180 tablet 3 06/29/2021 at 8pm   spironolactone (ALDACTONE) 25 MG tablet Take 0.5 tablets (12.5 mg total) by mouth daily. 45 tablet 2 06/30/2021   vitamin E 180 MG (400 UNITS) capsule Take 400 Units by mouth daily.   07/01/2021    Current Facility-Administered Medications:    0.9 %  sodium chloride infusion, 250 mL, Intravenous, Continuous, Anders Simmonds, MD   0.9 %  sodium chloride infusion, 250 mL, Intravenous, Continuous, Estill Cotta, NP, Last Rate: 10 mL/hr at 07/10/21 0812, 250 mL at 07/10/21 1601   aspirin chewable tablet 81 mg, 81 mg, Per Tube, Daily, Anders Simmonds, MD, 81 mg at 07/10/21 0843   chlorhexidine gluconate (MEDLINE KIT) (PERIDEX) 0.12 % solution 15 mL, 15 mL, Mouth Rinse, BID, Audria Nine, DO, 15 mL at 07/10/21 0808   Chlorhexidine Gluconate Cloth 2 % PADS 6 each, 6 each, Topical, Daily, Audria Nine, DO, 6 each at 07/10/21 0843   docusate (COLACE) 50 MG/5ML liquid 100 mg, 100 mg, Per Tube, BID, Bowser, Laurel Dimmer, NP, 100 mg at 07/10/21 0843   fentaNYL (SUBLIMAZE) injection 100 mcg, 100 mcg, Intravenous, Once, Welborn, Ryan, DO   insulin aspart (novoLOG) injection 0-15 Units, 0-15 Units, Subcutaneous, Q4H, Estill Cotta, NP   MEDLINE mouth rinse, 15 mL, Mouth Rinse, 10 times per day, Audria Nine, DO, 15 mL at 07/10/21 0556   norepinephrine (LEVOPHED) 4mg  in 232mL premix infusion, 2-10 mcg/min, Intravenous, Titrated, Freddi Starr, MD, Last Rate: 15 mL/hr at 07/10/21 0807, 4 mcg/min at 07/10/21 0807   pantoprazole (PROTONIX) injection 40 mg, 40 mg, Intravenous, QHS, Bowser, Grace E, NP, 40 mg at 07/09/21 2330   polyethylene glycol (MIRALAX / GLYCOLAX) packet 17 g, 17 g, Per Tube, Daily, Bowser, Grace E, NP, 17 g at 07/10/21 0843   ROS:  Unable to obtain robust ROS due to intubation, mental status, and patient is mute.   Blood pressure (!) 109/55, pulse (!) 52, temperature 98.1 F (36.7 C), temperature source Oral, resp. rate 20, height $RemoveBe'5\' 8"'vQPQrCLqv$  (1.727 m), weight 85.6 kg, SpO2 100 %.  General Examination:      EXAM ON NO SEDATION.                                                                                                  Physical Exam  General-critically ill appearing male lying intubated in ICU bed.  HEENT-  Normocephalic, no lesions, without obvious abnormality.  Normal external eye and conjunctiva.   Cardiovascular- RRR on tele. SBP 95.  Lungs-intubated on ventilator.  Abdomen- All 4 quadrants palpated and nontender Extremities- Warm, dry and intact. No edema LEs.  Musculoskeletal-no joint tenderness, deformity or swelling. Skin-warm and dry except for stage I decubitus on coccyx per PCCM note.   GCS:  Best eye  3   Best verbal  1   Best motor  3       Total 7  Neurological Examination Mental Status: Attempts to open eyes with noxious stimuli, but quickly closes.  Follows no commands. No speech due to ETT.  Cranial Nerves: II: OD pupil 63mm and non reactive. OS pupil 58mm and reactive.  III,IV, VI: Eyes midline.  V,VII: No corneal reflex on right. + on left. face is grossly symmetric with ETT. No reaction to brow pressure.  VIII: Opens right eye slightly on command with loud voice. Opens fully with noxious stimuli. No response to loud clap.  IX,X: + cough and gag with suctioning.  XI: head grossly midline.  XII: intubated.  Motor/Sensory:  No withdrawal or movement to noxious stimuli to RUE. He holds up LUE with any noxious stimuli to any body part. Withdraws to noxious stimuli slightly in RLE. LLE withdraws to noxious stimuli.  Tone and bulk:normal tone throughout; no atrophy noted. Deep Tendon Reflexes: Did not test due to COVID  isolation.  Plantars: Right: downgoing   Left: downgoing Cerebellar: No tremors, twitching, or shaking noted.  Gait: bedrest.    Lab Results: Basic Metabolic Panel: Recent Labs  Lab 06/30/2021 2236 07/09/21 1540 07/09/21 1546 07/09/21 1726 07/10/21 0008 07/10/21 0246  NA 137 143 144 145 143 145  K 4.1 4.2 4.2 3.8 4.5 3.7  CL 106 110  --   --  111  --   CO2 24 23  --   --  23  --   GLUCOSE 258* 226*  --   --  140*  --   BUN 47* 42*  --   --  41*  --   CREATININE 2.27* 1.96*  --   --  2.02*  --   CALCIUM 9.4 9.7  --   --  9.2  --   MG  --   --   --   --  1.9  --     CBC: Recent Labs  Lab 06/27/2021 2236 07/09/21 1540 07/09/21 1546 07/09/21 1726 07/10/21 0008 07/10/21 0246  WBC 6.0 11.2*  --   --  14.7*  --   NEUTROABS 4.6  --   --   --   --   --   HGB 11.8* 14.2 14.6 13.6 14.0 12.2*  HCT 35.1* 44.8 43.0 40.0 42.2 36.0*  MCV 100.0 98.9  --   --  99.5  --   PLT 319 417*  --   --  283  --     Cardiac Enzymes: Troponin 531 to 688.   Imaging: DG Chest 2 View  Result Date: 06/27/2021 CLINICAL DATA:  COVID positive.  Confusion. EXAM: CHEST - 2 VIEW COMPARISON:  None. FINDINGS: Low lung volumes. Heart size grossly normal. Question of retrocardiac hiatal hernia, not well assessed on the current exam. Coronary artery calcification or stent is seen. There are streaky bibasilar opacities. No pulmonary edema. No pleural effusion. No pneumothorax. Anti lordotic positioning without acute osseous abnormalities. IMPRESSION: Low lung volumes with streaky bibasilar opacities, likely atelectasis, however pneumonia cannot be excluded in the setting of COVID-19. Electronically Signed   By: Keith Rake M.D.   On: 06/22/2021 22:47   CT HEAD WO CONTRAST  Result Date: 07/09/2021 CLINICAL DATA:  Mental status change, unknown cause. No reported injury. EXAM: CT HEAD WITHOUT CONTRAST TECHNIQUE: Contiguous axial images were obtained from the base of the skull through the vertex without  intravenous contrast. COMPARISON:  Head CT from earlier today. FINDINGS: Brain: No evidence of parenchymal hemorrhage or extra-axial fluid collection. No mass lesion, mass effect, or midline shift. No CT evidence of acute infarction. Generalized cerebral volume loss. Nonspecific mild subcortical and periventricular white matter hypodensity, most in keeping with chronic small vessel ischemic change. No ventriculomegaly. Vascular: No acute abnormality. Skull: No evidence of calvarial fracture. Sinuses/Orbits: No fluid levels. Mucoperiosteal thickening throughout the bilateral ethmoidal air cells. Other:  The mastoid air cells are unopacified. IMPRESSION: 1. No evidence of acute intracranial abnormality. 2. Generalized cerebral volume loss and mild chronic small vessel ischemic changes in the cerebral white matter. 3. Mild chronic appearing paranasal sinusitis. Electronically Signed   By: Ilona Sorrel M.D.   On: 07/09/2021 16:31   CT Head Wo Contrast  Result Date: 07/09/2021 CLINICAL DATA:  Altered mental status. EXAM: CT HEAD WITHOUT CONTRAST TECHNIQUE: Contiguous axial images were obtained from the base of the skull through the vertex without intravenous contrast. COMPARISON:  None. FINDINGS: Brain: There is moderate severity cerebral atrophy with widening of the extra-axial spaces and ventricular dilatation. There are areas of decreased attenuation within the white matter tracts of the supratentorial brain, consistent with microvascular disease changes. Vascular: No hyperdense vessel or unexpected calcification. Skull: Normal. Negative for fracture or focal lesion. Sinuses/Orbits: No acute finding. Other: None. IMPRESSION: 1. Generalized cerebral atrophy. 2. No acute intracranial abnormality. Electronically Signed   By: Virgina Norfolk M.D.   On: 07/09/2021 01:21   MR BRAIN WO CONTRAST  Result Date: 07/10/2021 CLINICAL DATA:  Initial evaluation for mental status change, unknown cause. EXAM: MRI HEAD  WITHOUT CONTRAST TECHNIQUE: Multiplanar, multiecho pulse sequences of the brain and surrounding structures were obtained without intravenous contrast. COMPARISON:  Prior head CT from earlier the same day. FINDINGS: Brain: Generalized age-related cerebral atrophy. There is abnormal restricted diffusion involving a large portion of the left MCA distribution, with involvement of the left frontal, parietal, and temporal lobes. Involvement of the left insular cortex as well as the underlying left basal ganglia and corona radiata. No significant regional mass effect at this time. Serpiginous susceptibility artifact within the area of infarction most consistent with  petechial hemorrhage, although intraluminal thrombus within distal MCA branches could be contributory as well (series 14, image 32). No frank intraparenchymal hematoma. No other evidence for acute or subacute ischemia. Gray-white matter differentiation otherwise maintained. No encephalomalacia to suggest chronic cortical infarction. Few small remote lacunar infarcts noted about the basal ganglia and left thalamus. No mass lesion or midline shift. No hydrocephalus or extra-axial fluid collection. Pituitary gland and suprasellar region within normal limits. Midline structures intact. Vascular: Abnormal flow void seen within the left ICA to the level of the terminus, with extension into the left M1 segment and its distal branches. Again, above described susceptibility artifact at the level of the sylvian fissure could reflect petechial hemorrhage and/or intraluminal thrombus within distal MCA branches. Additionally, there is loss of normal flow void within the left V3/V4 segments, which could be related to slow flow and/or occlusion (series 10, image 6). Otherwise, major intracranial vascular flow voids are maintained. Skull and upper cervical spine: Craniocervical junction within normal limits. Bone marrow signal intensity normal. No scalp soft tissue  abnormality. Sinuses/Orbits: Sequelae of prior bilateral ocular lens replacement. Globes and orbital soft tissues demonstrate no other acute finding. Scattered mucosal thickening noted within the ethmoidal air cells. Enteric tube partially visualized. Associated layering fluid noted within the nasopharynx. No significant mastoid effusion. Inner ear structures grossly normal. Other: None. IMPRESSION: 1. Fairly large evolving acute ischemic left MCA distribution infarct as detailed above. Superimposed susceptibility artifact suggestive of associated petechial hemorrhage, although intraluminal thrombus within distal left MCA branches may be contributory as well. No frank intraparenchymal hematoma or significant regional mass effect at this time. 2. Abnormal flow void within the left ICA to the level of the terminus, likely occluded. Irregular flow void within the left MCA distribution could be related to occlusion and/or slow flow as well. 3. Few underlying small remote lacunar infarcts within the bilateral basal ganglia and left thalamus. 4. Abnormal flow void within the left the V3/V4 segments, which could be related to slow flow and/or occlusion. Critical Value/emergent results were called by telephone at the time of interpretation on 07/10/2021 at 1:35 am to provider Dr. Colonel Bald, who verbally acknowledged these results. Electronically Signed   By: Jeannine Boga M.D.   On: 07/10/2021 01:51   DG Chest Portable 1 View  Result Date: 07/09/2021 CLINICAL DATA:  Post intubation EXAM: PORTABLE CHEST 1 VIEW COMPARISON:  Chest x-ray dated July 08, 2021 FINDINGS: Interval intubation with ET tube tip positioned approximately 3 cm from the carina. Cardiac and mediastinal contours are unchanged. Mild scattered linear opacities, likely atelectasis. No large pleural effusion or pneumothorax. IMPRESSION: Interval intubation with ET tube tip positioned approximately 3 cm from the carina. Electronically Signed   By:  Yetta Glassman MD   On: 07/09/2021 16:25   DG Abd Portable 1V  Result Date: 07/09/2021 CLINICAL DATA:  85 year old male with NG placement. EXAM: PORTABLE ABDOMEN - 1 VIEW COMPARISON:  None. FINDINGS: Partially visualized enteric tube with side-port in the body of the stomach and tip in the distal stomach. Dilated small bowel loops in the right lower abdomen measuring approximately 5 cm. IMPRESSION: 1. Enteric tube with tip in the distal stomach. 2. Dilated small bowel loops in the right lower abdomen. Electronically Signed   By: Anner Crete M.D.   On: 07/09/2021 17:17     Assessment: 85 year old male who presented on Thursday with a 3 day history of AMS, balance difficulty and 1 episode of transient facial droop. Symptoms  worsened after admission and MRI was obtained, revealing an acute left MCA territory ischemic infarction 1. Exam reveals movement of LUE, BLEs, but not RUE. No corneal reflex on right which may indicate trigeminal injury, facial nerve injury or ICU phenomenon as we have seen this before in ICU patients.  2. MRI brain: Fairly large evolving acute ischemic left MCA distribution infarction with superimposed susceptibility artifact suggestive of associated petechial hemorrhage. No frank intraparenchymal hematoma or significant regional mass effect. Abnormal flow void within the left ICA to the level of the terminus, likely occluded. Irregular flow void within the left MCA distribution could be related to occlusion and/or slow flow as well. Few underlying small remote lacunar infarcts within the bilateral basal ganglia and left thalamus. Abnormal flow void within the left the V3/V4 segments, which could be related to slow flow and/or occlusion. 3. DDx regarding underlying etiologies for the patient's stroke include cardioembolic, atheroembolic and in situ thrombosis.   Recommendations: Stroke workup to include TTE, MRA head and carotid ultrasound, as well ac cardiac telemetry.   Fasting lipid panel. Can continue statin.  OK to continue ASA 81 mg. Relatively low risk of the petechial hemorrhage converting to gross hemorrhage.  Goal SBP 120-140.  HbA1c with goal < 7.  Frequent neuro checks Risk factor modification PT consult, OT consult, Speech consult when able.  Stroke education.  Stroke team to follow.  Electronically signed: Dr. Kerney Elbe 07/10/2021, 7:39 AM

## 2021-07-10 NOTE — Progress Notes (Signed)
PT Cancellation Note  Patient Details Name: Tristan Hart MRN: [id number] DOB: 11-Mar-1936   Cancelled Treatment:    Reason Eval/Treat Not Completed: Other (comment) (Pt not following commands and not ready for PT today. Will check back at later date.)   Bevelyn Buckles 07/10/2021, 9:23 AM [facility] M,PT Acute Rehab Services [phone] (pager)

## 2021-07-10 NOTE — Progress Notes (Signed)
ANTICOAGULATION CONSULT NOTE - Initial Consult  Pharmacy Consult for Heparin Indication: chest pain/ACS  Not on File  Patient Measurements: Height: _0  (172.7 cm) Weight: 85.6 kg (188 lb 11.4 oz) IBW/kg (Calculated) : 68.4  Vital Signs: Temp: 100.3 F (37.9 C) (07/29 0020) Temp Source: Oral (07/29 0020) BP: 99/62 (07/29 0100) Pulse Rate: 62 (07/29 0100)  Labs: Recent Labs    06/17/2021 2236 07/09/21 0600 07/09/21 1540 07/09/21 1546 07/09/21 1726 07/09/21 1938 07/10/21 0008  HGB 11.8*  --  14.2 14.6 13.6  --  14.0  HCT 35.1*  --  44.8 43.0 40.0  --  42.2  PLT 319  --  417*  --   --   --  283  APTT  --  35  --   --   --   --   --   LABPROT  --  15.0  --   --   --   --   --   INR  --  1.2  --   --   --   --   --   CREATININE 2.27*  --  1.96*  --   --   --  2.02*  TROPONINIHS  --   --  96*  --   --  164* 531*    Estimated Creatinine Clearance: 29.5 mL/min (A) (by C-G formula based on SCr of 2.02 mg/dL (H)).   Medical History: Past Medical History:  Diagnosis Date   Congestive heart failure (CHF) ([facility])    Hyperlipidemia    Hypertension     Medications:  Scheduled:   aspirin  81 mg Per Tube Daily   atorvastatin  40 mg Per Tube QHS   chlorhexidine gluconate (MEDLINE KIT)  15 mL Mouth Rinse BID   Chlorhexidine Gluconate Cloth  6 each Topical Daily   docusate  100 mg Per Tube BID   enoxaparin (LOVENOX) injection  30 mg Subcutaneous Q24H   fentaNYL (SUBLIMAZE) injection  100 mcg Intravenous Once   furosemide  20 mg Per Tube Daily   mouth rinse  15 mL Mouth Rinse 10 times per day   metoprolol succinate  50 mg Oral Daily   pantoprazole (PROTONIX) IV  40 mg Intravenous QHS   polyethylene glycol  17 g Per Tube Daily   sacubitril-valsartan  1 tablet Per Tube BID   spironolactone  12.5 mg Per Tube Daily    Assessment: 85 y.o. male with elevated troponin, possible ACS, for heparin Goal of Therapy:  Heparin level 0.3-0.7 units/ml Monitor platelets by  anticoagulation protocol: Yes   Plan:  D/C Lovenox Heparin 2000 units IV bolus, then start heparin 1000 units/hr Check heparin level in 8 hours.   Caryl Pina 07/10/2021,1:32 AM

## 2021-07-10 NOTE — Progress Notes (Signed)
eLink Physician-Brief Progress Note Patient Name: Tristan Hart DOB: 1936-07-04 MRN: [id number]   Date of Service  07/10/2021  HPI/Events of Note  Called by radiology. MRI with large L MCA territory infarct with question of small petechial hemorrhages. Will D/C Heparin IV infusion d/t risk of hemorrhagic conversion.  eICU Interventions  Plan: D/C Heparin IV infusion.        [name] 07/10/2021, 1:41 AM

## 2021-07-10 NOTE — Progress Notes (Signed)
Pharmacy Antibiotic Note  Tristan Hart is a 85 y.o. male admitted on 06/28/2021 and now with concern for aspiration PNA. Pharmacy has been consulted for Unasyn dosing.  SCr 2.02 (Hx CKD, BL 1.8) - CrCl~30 ml/min. No allergies to PCN noted.   Plan: - Start Unasyn 3g IV every 12 hours - Will continue to follow renal function, culture results, LOT, and antibiotic de-escalation plans   Height: 5\' 8"  (172.7 cm) Weight: 85.6 kg (188 lb 11.4 oz) IBW/kg (Calculated) : 68.4  Temp (24hrs), Avg:99.4 F (37.4 C), Min:96.4 F (35.8 C), Max:102.74 F (39.3 C)  Recent Labs  Lab 07/05/2021 2236 07/09/21 0600 07/09/21 1540 07/10/21 0008  WBC 6.0  --  11.2* 14.7*  CREATININE 2.27*  --  1.96* 2.02*  LATICACIDVEN 1.4 0.8  --   --     Estimated Creatinine Clearance: 29.5 mL/min (A) (by C-G formula based on SCr of 2.02 mg/dL (H)).    Not on File  Antimicrobials this admission: Unasyn 7/29 >>  Microbiology results: 7/27 COVID >> positive 7/28 MRSA PCR: neg  Thank you for allowing pharmacy to be a part of this patient's care.  8/28, PharmD, BCPS Clinical Pharmacist Clinical phone for 07/10/2021: [phone] 07/10/2021 10:39 AM   **Pharmacist phone directory can now be found on [email or web address] ([email or web address]).  Listed under [facility].

## 2021-07-10 NOTE — Progress Notes (Signed)
eLink Physician-Brief Progress Note Patient Name: Tristan Hart DOB: June 15, 1936 MRN: [id number]   Date of Service  07/10/2021  HPI/Events of Note  Request received for Tylenol for fever Already on ampicillin sulbactam CXR reviewed and OGT in place  eICU Interventions  Ordered Tylenol prn     Intervention Category Minor Interventions: Routine modifications to care plan (e.g. PRN medications for pain, fever)  Rosalie Gums Sorin Frimpong 07/10/2021, 8:35 PM

## 2021-07-10 NOTE — Progress Notes (Signed)
eLink Physician-Brief Progress Note Patient Name: Tristan Hart DOB: 11-12-1936 MRN: [id number]   Date of Service  07/10/2021  HPI/Events of Note  Patient had 9 beat run of NSVT. Mg++ = 1.9.  eICU Interventions  Plan: Replace Mg++. Send AM labs now.      Intervention Category Major Interventions: Arrhythmia - evaluation and management  Yosiah Jasmin Dennard Nip 07/10/2021, 4:39 AM

## 2021-07-10 NOTE — Progress Notes (Signed)
OT Cancellation Note  Patient Details Name: Tristan Hart MRN: [id number] DOB: 1936/08/05   Cancelled Treatment:    Reason Eval/Treat Not Completed: Medical issues which prohibited therapy;Patient not medically ready. OT to continue efforts toward completion of evaluation at a later date.   [name] OTR/L Supplemental OT, Department of rehab services [phone]  [name] R H. 07/10/2021, 9:24 AM

## 2021-07-10 NOTE — Progress Notes (Signed)
eLink Physician-Brief Progress Note Patient Name: [name] DOB: [birth year] MRN: [id number]   Date of Service  07/10/2021  HPI/Events of Note  Troponin = 164 --> 531. Already started on ASA. Patient is on a Phenylephrine IV infusion, therefore, can't B-Block. Head CT scan from 7/28 reveals: 1. No evidence of acute intracranial abnormality. 2. Generalized cerebral volume loss and mild chronic small vessel ischemic changes in the cerebral white matter. 3. Mild chronic appearing paranasal sinusitis.  eICU Interventions  Plan: Heparin per pharmacy consultation 12 Lead EKG now.     Intervention Category Major Interventions: Other:  Avid Guillette Dennard Nip 07/10/2021, 1:20 AM

## 2021-07-10 NOTE — Plan of Care (Signed)
  Problem: Safety: Goal: Non-violent Restraint(s) Outcome: Progressing   Problem: Education: Goal: Knowledge of General Education information will improve Description: Including pain rating scale, medication(s)/side effects and non-pharmacologic comfort measures Outcome: Progressing   Problem: Health Behavior/Discharge Planning: Goal: Ability to manage health-related needs will improve Outcome: Progressing   Problem: Clinical Measurements: Goal: Ability to maintain clinical measurements within normal limits will improve Outcome: Progressing Goal: Will remain free from infection 07/10/2021 0513 by Caswell Corwin, RN Outcome: Progressing 07/09/2021 2224 by Caswell Corwin, RN Outcome: Progressing Goal: Diagnostic test results will improve Outcome: Progressing Goal: Respiratory complications will improve Outcome: Progressing Goal: Cardiovascular complication will be avoided Outcome: Progressing   Problem: Activity: Goal: Risk for activity intolerance will decrease Outcome: Progressing   Problem: Nutrition: Goal: Adequate nutrition will be maintained Outcome: Progressing   Problem: Coping: Goal: Level of anxiety will decrease Outcome: Progressing   Problem: Elimination: Goal: Will not experience complications related to bowel motility Outcome: Progressing Goal: Will not experience complications related to urinary retention Outcome: Progressing   Problem: Pain Managment: Goal: General experience of comfort will improve 07/10/2021 0513 by Caswell Corwin, RN Outcome: Progressing 07/09/2021 2224 by Caswell Corwin, RN Outcome: Progressing   Problem: Safety: Goal: Ability to remain free from injury will improve 07/10/2021 0513 by Caswell Corwin, RN Outcome: Progressing 07/09/2021 2224 by Caswell Corwin, RN Outcome: Progressing   Problem: Skin Integrity: Goal: Risk for impaired skin integrity will decrease 07/10/2021 0513 by Caswell Corwin, RN Outcome: Progressing 07/09/2021 2224 by  Caswell Corwin, RN Outcome: Progressing   Problem: Spontaneous Subarachnoid Hemorrhage Tissue Perfusion: Goal: Complications of Spontaneous Subarachnoid Hemorrhage will be minimized Outcome: Progressing

## 2021-07-10 NOTE — Progress Notes (Signed)
eLink Physician-Brief Progress Note Patient Name: Tristan Hart DOB: 1936/06/28 MRN: [id number]   Date of Service  07/10/2021  HPI/Events of Note  Oliguria   eICU Interventions  Plan: Bolus with 0.9 NaCl 500 mL IV over 30 minutes now.     Intervention Category Major Interventions: Other:  [name] 07/10/2021, 4:35 AM

## 2021-07-10 NOTE — Progress Notes (Signed)
Pt came up from the ED with an order for soft wrist restraints, however pt was not actually in restraints and they were not indicated upon arrival to the unit. Non-violent restraint order discontinued. Will continue to monitor closely.

## 2021-07-10 NOTE — Progress Notes (Signed)
NAME:  Tristan Hart, MRN:  [id number], DOB:  08-31-1936, LOS: 1 ADMISSION DATE:  07/04/2021, CONSULTATION DATE:  7/28 REFERRING MD:  FPTS, CHIEF COMPLAINT:  respiratory failure    History of Present Illness:  85yo male with hx HTN, CHF, CKDIII who tested POS for COVID 7/20 (home test, unknown vaccination status) presented 7/28 with 3 days AMS, poor balance. He was seen in ER where initial w/u was essentially NEG with normal head CT, reassuring neuro exam and was being admitted by FPTS.  However while in ER he became agitated, then somnolent then had acute unresponsiveness and PCCM called urgently for intubation and ICU admission.   Pertinent  Medical History   has a past medical history of Congestive heart failure (CHF) (HCC), Hyperlipidemia, and Hypertension.   Significant Hospital Events: Including procedures, antibiotic start and stop dates in addition to other pertinent events   CT head 7/28>>neg acute  7/29 MRI demonstrated L MCA infarct and small lacunar infarcts   Interim History / Subjective:  Overnight- MRI obtained. Troponin increased. Oliguria with 500 cc bolus  +638 since admit  On 80 of phenylephrine  Unable to obtain subjective evaluation due to patient status   Objective   Blood pressure (!) 109/55, pulse (!) 52, temperature 98.1 F (36.7 C), temperature source Oral, resp. rate 20, height 5\' 8"  (1.727 m), weight 85.6 kg, SpO2 100 %.    Vent Mode: PRVC FiO2 (%):  [60 %-100 %] 70 % Set Rate:  [18 bmp-20 bmp] 20 bmp Vt Set:  [540 mL] 540 mL PEEP:  [5 cmH20-8 cmH20] 8 cmH20 Plateau Pressure:  [20 cmH20-23 cmH20] 20 cmH20   Intake/Output Summary (Last 24 hours) at 07/10/2021 0732 Last data filed at 07/10/2021 0600 Gross per 24 hour  Intake 1573.48 ml  Output 935 ml  Net 638.48 ml   Filed Weights   07/09/21 0930 07/09/21 2015  Weight: 84.3 kg 85.6 kg    Examination: General:  in bed, NAD, ill appearing HEENT: MM pink/moist, anicteric, trachea  midline Neuro: GCS6T, eyes to pain, withdraws on L, scant movement on R, L pupil 6, R, 3, sluggish, +CCG, RASS -1 CV: S1S2, SR , no m/r/g appreciated PULM:  clear in the upper lobes and in the lower lobes, chest expansion symmetric, peak pressure 23 GI: soft, bsx4 active, non distended Extremities: dry, handsfeet cool, arms/legs warm,  no pretibial edema, capillary refill less than 3 seconds  Skin: stage 1 on coccyx, no rashes or lesions noted   Resolved Hospital Problem list     Assessment & Plan:  Acute respiratory failure with hypoxemia -ABG 7/28 7.31/43/78/24. Intubated in the ED due to AMS Aspiration Pneumonia- RLL infiltrate on CXR COVID infection  P: -LTVV strategy with tidal volumes of 4-8 cc/kg ideal body weight -Goal plateau pressures less than 30 and driving pressures of less than 15 -Wean PEEP/FiO2 for SpO2 92-98% -Follow intermittent CXR and ABG PRN -VAP bundle -Daily SAT and SBT -Not currently on sedation -Starting Unasyn per pharmacy   Left MCA infarct, remote lacunar infarcts, small petechial hemorrhages - as seen on 7/29 MRI brain AMS ? Secondary to above P: -Neurology consulted. Discussed with Dr. 8/29. Neurology to see. Will follow for recommendations. Workup per neurology. AED per neurology -Continue neuroprotective measures -Continue aspiration precautions -Start nutrition -Not currently on sedation.   Shock- ?sepsis vs cardiogenic vs mixed, +COVID, CHF hx, Tmax 102.7, WBC 14.2 Troponin elevation- [phone]. Suspect demand. No obvious ST changes seen on 12 lead. Lactate 1.4>0.8 -  Goal MAP 65 -Continue peripheral pressors. Switched from neo to levo due to bradycardia. Titrate medication to MAP goal. - positive 683 ml admit, may consider further conservative  fluid administration in the setting of HF -Trend procalcitonin -Continue trending troponin -PRN EKG -Unable to anticoagulate due to L MCA infarct with petechial hemorrhage.    Hx of  HFrEF -Echo 02/18/2021 with EF 25-30% and grade 2 diastolic dysfunction, he follow Dr. Clarise Cruz  Hx of HTN/HLD -Home medications include lipitor, lasix, imdur, toprol-xl, entresto, and aldactone  P: -continue tele -holding home GDMT and antihypertensives due to pressor requirements -Strict I and O -Goal K above 4, goal MG above 2 -On ASA and statin -Obtain limited echo.  AKI superimposed on CKD stage IIIb -Baseline creatinine 1.8, creatinine on admission 2.27> 2.02, P: -Ensure renal perfusion. Goal MAP 65 or greater. -Avoid neprotoxic drugs as possible. -Strict I&O's -Follow up AM creatinine -Continue foley  Hx of diabetes  -Home medications include; glipizide and Jardiance  P: -Blood Glucose goal 140-180. -Start SSI  -holding home medications  Best Practice   Diet/type: tubefeeds DVT prophylaxis: SCD GI prophylaxis: PPI Lines: N/A Foley:  Yes, and it is still needed Code Status:  full code Last date of multidisciplinary goals of care discussion: Pending, plan on updating son and GOC discussion today.    Critical care time: 35 minutes   CRITICAL CARE Performed by: Naida Sleight., MSN, APRN, AGACNP-BC [facility]  07/10/2021 , 7:32 AM  Please see [email or web address] for pager details  If no response, please call [phone] After hours, please call Elink at [phone]

## 2021-07-10 NOTE — Progress Notes (Signed)
Initial Nutrition Assessment  DOCUMENTATION CODES:   Not applicable  INTERVENTION:   Initiate tube feeds via OG tube: - Start Vital AF 1.2 @ 20 ml/hr and advance by 10 ml q 8 hours to goal rate of 60 ml/hr (1440 ml/day)  Tube feeding regimen provides 1728 kcal, 108 grams of protein, and 1168 ml of H2O.   NUTRITION DIAGNOSIS:   Increased nutrient needs related to acute illness (COVID-19, AKI) as evidenced by estimated needs.  GOAL:   Patient will meet greater than or equal to 90% of their needs  MONITOR:   Vent status, Labs, Weight trends, TF tolerance, Skin, I & O's  REASON FOR ASSESSMENT:   Ventilator, Consult Enteral/tube feeding initiation and management  ASSESSMENT:   85 year old male who presented to the ED on 7/27 with report of COVID-19 and AMS. PMH of HTN, HLD, CKD stage IIIb, CHF. Pt became unresponsive while in the ED and required intubation. Pt admitted with acute hypoxic respiratory failure, acute encephalopathy, AKI.  Discussed pt with RN and during ICU rounds. Pt with large L MCA infarct with question of small petechial hemorrhages. Neurology consulted. Pt also with aspiration pneumonia.  Consult received for tube feeding initiation and management. Pt with OG tube in stomach per x-ray, currently clamped.  Reviewed weight history in chart. No weight history available prior to December 2021. Pt with steady decline in weight since that time. Overall, pt has lost 20.5 kg since 12/10/20. This is a 19.3% weight loss in 7 months which is severe and significant for timeframe. Pt is at risk for malnutrition.  Admit weight: 84.3 kg Current weight: 85.6 kg  Patient is currently intubated on ventilator support MV: 10.4 L/min Temp (24hrs), Avg:99.3 F (37.4 C), Min:96.4 F (35.8 C), Max:102.74 F (39.3 C) BP (cuff): 123/63 MAP (cuff): 81  Drips: Levophed NS: 10 ml/hr  Medications reviewed and include: colace, SSI q 4 hours, IV protonix, miralax, klor-con 40  mEq once  Labs reviewed: BUN 41, creatinine 2.02, hemoglobin A1C 8.0 CBG's: 96-247 x 24 hours  UOP: 935 ml x 24 hours I/O's: +448 ml since admit  NUTRITION - FOCUSED PHYSICAL EXAM:  Deferred.  Diet Order:   Diet Order             Diet NPO time specified  Diet effective now                   EDUCATION NEEDS:   No education needs have been identified at this time  Skin:  Skin Assessment: Skin Integrity Issues: Stage I: coccyx  Last BM:  07/09/21  Height:   Ht Readings from Last 1 Encounters:  07/09/21 5\' 8"  (1.727 m)    Weight:   Wt Readings from Last 1 Encounters:  07/09/21 85.6 kg    BMI:  Body mass index is 28.69 kg/m.  Estimated Nutritional Needs:   Kcal:  1550-1750  Protein:  105-120 grams  Fluid:  1.6-1.8 L    07/11/21, MS, RD, LDN Inpatient Clinical Dietitian Please see AMiON for contact information.

## 2021-07-10 NOTE — Progress Notes (Signed)
Called Tristan Hart's son [name] at [phone]. Notified him of MRI results along and updated him regarding his dad's status. GOC was briefly discussed. Per [name] "in the ED he said he wanted everything done." Continuing full scope of care.  [name]., MSN, APRN, AGACNP-BC [facility]  07/10/2021 , 11:18 AM  Please see [email or web address] for pager details  If no response, please call [phone] After hours, please call Elink at [phone]

## 2021-07-11 ENCOUNTER — Inpatient Hospital Stay (HOSPITAL_COMMUNITY): Payer: Medicare HMO

## 2021-07-11 DIAGNOSIS — R579 Shock, unspecified: Secondary | ICD-10-CM | POA: Diagnosis not present

## 2021-07-11 DIAGNOSIS — I5020 Unspecified systolic (congestive) heart failure: Secondary | ICD-10-CM | POA: Diagnosis not present

## 2021-07-11 DIAGNOSIS — L8990 Pressure ulcer of unspecified site, unspecified stage: Secondary | ICD-10-CM | POA: Insufficient documentation

## 2021-07-11 DIAGNOSIS — I639 Cerebral infarction, unspecified: Secondary | ICD-10-CM | POA: Diagnosis not present

## 2021-07-11 LAB — CBC
HCT: 37.2 % — ABNORMAL LOW (ref 39.0–52.0)
Hemoglobin: 12.6 g/dL — ABNORMAL LOW (ref 13.0–17.0)
MCH: 31.8 pg (ref 26.0–34.0)
MCHC: 33.9 g/dL (ref 30.0–36.0)
MCV: 93.9 fL (ref 80.0–100.0)
Platelets: 297 K/uL (ref 150–400)
RBC: 3.96 MIL/uL — ABNORMAL LOW (ref 4.22–5.81)
RDW: 13.5 % (ref 11.5–15.5)
WBC: 21 K/uL — ABNORMAL HIGH (ref 4.0–10.5)
nRBC: 0 % (ref 0.0–0.2)

## 2021-07-11 LAB — ECHOCARDIOGRAM LIMITED
Calc EF: 16.8 %
Height: 68 in
S' Lateral: 5 cm
Single Plane A2C EF: 26.4 %
Single Plane A4C EF: 10 %
Weight: 3019.42 oz

## 2021-07-11 LAB — GLUCOSE, CAPILLARY
Glucose-Capillary: 183 mg/dL — ABNORMAL HIGH (ref 70–99)
Glucose-Capillary: 190 mg/dL — ABNORMAL HIGH (ref 70–99)
Glucose-Capillary: 213 mg/dL — ABNORMAL HIGH (ref 70–99)
Glucose-Capillary: 179 mg/dL — ABNORMAL HIGH (ref 70–99)
Glucose-Capillary: 183 mg/dL — ABNORMAL HIGH (ref 70–99)

## 2021-07-11 LAB — COMPREHENSIVE METABOLIC PANEL WITH GFR
ALT: 29 U/L (ref 0–44)
AST: 32 U/L (ref 15–41)
Albumin: 2.4 g/dL — ABNORMAL LOW (ref 3.5–5.0)
Alkaline Phosphatase: 63 U/L (ref 38–126)
Anion gap: 6 (ref 5–15)
BUN: 39 mg/dL — ABNORMAL HIGH (ref 8–23)
CO2: 21 mmol/L — ABNORMAL LOW (ref 22–32)
Calcium: 8.9 mg/dL (ref 8.9–10.3)
Chloride: 113 mmol/L — ABNORMAL HIGH (ref 98–111)
Creatinine, Ser: 1.87 mg/dL — ABNORMAL HIGH (ref 0.61–1.24)
GFR, Estimated: 35 mL/min — ABNORMAL LOW (ref >60)
Glucose, Bld: 197 mg/dL — ABNORMAL HIGH (ref 70–99)
Potassium: 3.7 mmol/L (ref 3.5–5.1)
Sodium: 140 mmol/L (ref 135–145)
Total Bilirubin: 2 mg/dL — ABNORMAL HIGH (ref 0.3–1.2)
Total Protein: 5.6 g/dL — ABNORMAL LOW (ref 6.5–8.1)

## 2021-07-11 LAB — TROPONIN I (HIGH SENSITIVITY): Troponin I (High Sensitivity): 269 ng/L (ref <18)

## 2021-07-11 LAB — MAGNESIUM
Magnesium: 1.9 mg/dL (ref 1.7–2.4)
Magnesium: 1.9 mg/dL (ref 1.7–2.4)

## 2021-07-11 LAB — PHOSPHORUS
Phosphorus: 1.9 mg/dL — ABNORMAL LOW (ref 2.5–4.6)
Phosphorus: 2.7 mg/dL (ref 2.5–4.6)

## 2021-07-11 MED ORDER — AMPICILLIN-SULBACTAM SODIUM 3 (2-1) G IJ SOLR
3.0000 g | Freq: Three times a day (TID) | INTRAMUSCULAR | Status: DC
  Administered 2021-07-11 – 2021-07-14 (×9): 3 g via INTRAVENOUS
  Filled 2021-07-11 (×10): qty 8

## 2021-07-11 MED ORDER — FUROSEMIDE 10 MG/ML IJ SOLN
40.0000 mg | Freq: Once | INTRAMUSCULAR | Status: AC
  Administered 2021-07-11: 40 mg via INTRAVENOUS
  Filled 2021-07-11: qty 4

## 2021-07-11 MED ORDER — PERFLUTREN LIPID MICROSPHERE
1.0000 mL | INTRAVENOUS | Status: AC | PRN
  Administered 2021-07-11: 2 mL via INTRAVENOUS
  Filled 2021-07-11: qty 10

## 2021-07-11 MED ORDER — POTASSIUM PHOSPHATES 15 MMOLE/5ML IV SOLN
20.0000 mmol | Freq: Once | INTRAVENOUS | Status: AC
  Administered 2021-07-11: 20 mmol via INTRAVENOUS
  Filled 2021-07-11: qty 6.67

## 2021-07-11 NOTE — Progress Notes (Addendum)
STROKE TEAM PROGRESS NOTE   INTERVAL HISTORY No one is at the bedside. He is in COVID isolation. He is on no sedation and opens eyes to voice, but did not attend or follow commands.  Stroke work up underway.   Vitals:   07/11/21 1400 07/11/21 1500 07/11/21 1600 07/11/21 1639  BP: 127/76 137/69 (!) 127/91 (!) 143/76  Pulse: (!) 55 72 (!) 110   Resp:      Temp: 98.24 F (36.8 C) 98.24 F (36.8 C) 98.24 F (36.8 C)   TempSrc:      SpO2: 99% 99% (!) 88% 100%  Weight:      Height:       CBC:  Recent Labs  Lab 06/29/2021 2236 07/09/21 1540 07/10/21 0008 07/10/21 0246 07/11/21 0509  WBC 6.0   < > 14.7*  --  21.0*  NEUTROABS 4.6  --   --   --   --   HGB 11.8*   < > 14.0 12.2* 12.6*  HCT 35.1*   < > 42.2 36.0* 37.2*  MCV 100.0   < > 99.5  --  93.9  PLT 319   < > 283  --  297   < > = values in this interval not displayed.   Basic Metabolic Panel:  Recent Labs  Lab 07/10/21 0008 07/10/21 0246 07/10/21 1831 07/11/21 0509  NA 143 145  --  140  K 4.5 3.7  --  3.7  CL 111  --   --  113*  CO2 23  --   --  21*  GLUCOSE 140*  --   --  197*  BUN 41*  --   --  39*  CREATININE 2.02*  --   --  1.87*  CALCIUM 9.2  --   --  8.9  MG 1.9  --  1.9 1.9  PHOS  --   --  2.9 1.9*   Lipid Panel: No results for input(s): CHOL, TRIG, HDL, CHOLHDL, VLDL, LDLCALC in the last 168 hours. HgbA1c:  Recent Labs  Lab 07/10/21 0848  HGBA1C 8.0*   Urine Drug Screen: No results for input(s): LABOPIA, COCAINSCRNUR, LABBENZ, AMPHETMU, THCU, LABBARB in the last 168 hours.  Alcohol Level No results for input(s): ETH in the last 168 hours.  IMAGING past 24 hours ECHOCARDIOGRAM LIMITED  Result Date: 07/11/2021    ECHOCARDIOGRAM LIMITED REPORT   Patient Name:   Tristan Hart Date of Exam: 07/11/2021 Medical Rec #:  [id number]     Height:       68.0 in Accession #:    [id number]    Weight:       188.7 lb Date of Birth:  05/15/1936     BSA:          1.994 m Patient Age:    85 years      BP:           131/69  mmHg Patient Gender: M             HR:           78 bpm. Exam Location:  Inpatient Procedure: 2D Echo, Limited Echo, Cardiac Doppler, Color Doppler and            Intracardiac Opacification Agent Indications:    I50.40* Unspecified combined systolic (congestive) and diastolic                 (congestive) heart failure  History:        Patient has  prior history of Echocardiogram examinations, most                 recent 02/18/2021. CHF, Abnormal ECG, Signs/Symptoms:Altered                 Mental Status and Fever; Risk Factors:Dyslipidemia and                 Hypertension. Covid positive.  Sonographer:    Sheralyn Boatmanina West RDCS Referring Phys: [id number] Martina SinnerJONATHAN B DEWALD  Sonographer Comments: Echo performed with patient supine and on artificial respirator. IMPRESSIONS  1. Left ventricular ejection fraction, by estimation, is 25 to 30%. The left ventricle has severely decreased function. The left ventricle demonstrates regional wall motion abnormalities (suggestive of LAD disease). There is mild concentric left ventricular hypertrophy. There is no evidence of LV thrombus.  2. Moderate pericardial effusion. The pericardial effusion is posterior and lateral to the left ventricle. There is IVC dilation but with collapsability, respirophasic variation in valvular inflow velocities but without RV or RA collapse.  3. The inferior vena cava is dilated in size with >50% respiratory variability, suggesting right atrial pressure of 8 mmHg.  4. The mitral valve is grossly normal. No evidence of mitral valve regurgitation.  5. The aortic valve is tricuspid. There is mild calcification of the aortic valve. There is mild thickening of the aortic valve. Aortic valve regurgitation is not visualized. Mild aortic valve sclerosis is present, with no evidence of aortic valve stenosis.  6. Tricuspid regurgitation signal is inadequate for assessing PA pressure. Comparison(s): A prior study was performed on 02/18/2021. No significant change from prior  study. Conclusion(s)/Recommendation(s): Consider future repeat echo if concern for cardiac tamponade (not suggested on this exam). FINDINGS  Left Ventricle: Left ventricular ejection fraction, by estimation, is 25 to 30%. The left ventricle has severely decreased function. The left ventricle demonstrates regional wall motion abnormalities. Definity contrast agent was given IV to delineate the left ventricular endocardial borders. There is mild concentric left ventricular hypertrophy.  LV Wall Scoring: The mid and distal anterior wall, mid and distal lateral wall, mid and distal anterior septum, entire apex, mid anterolateral segment, and mid inferoseptal segment are hypokinetic. Right Ventricle: Tricuspid regurgitation signal is inadequate for assessing PA pressure. Pericardium: A moderately sized pericardial effusion is present. The pericardial effusion is posterior and lateral to the left ventricle. Mitral Valve: The mitral valve is grossly normal. There is mild thickening of the mitral valve leaflet(s). There is mild calcification of the mitral valve leaflet(s). Tricuspid Valve: The tricuspid valve is normal in structure. Tricuspid valve regurgitation is mild. Aortic Valve: The aortic valve is tricuspid. There is mild calcification of the aortic valve. There is mild thickening of the aortic valve. Aortic valve regurgitation is not visualized. Mild aortic valve sclerosis is present, with no evidence of aortic valve stenosis. Aorta: The aortic root and ascending aorta are structurally normal, with no evidence of dilitation when indexed to age and body surface area. Venous: The inferior vena cava is dilated in size with greater than 50% respiratory variability, suggesting right atrial pressure of 8 mmHg. LEFT VENTRICLE PLAX 2D LVIDd:         5.70 cm LVIDs:         5.00 cm LV PW:         1.40 cm LV IVS:        1.20 cm LVOT diam:     2.30 cm LVOT Area:     4.15 cm  LV Volumes (  MOD) LV vol d, MOD A2C: 201.0 ml LV vol  d, MOD A4C: 209.0 ml LV vol s, MOD A2C: 148.0 ml LV vol s, MOD A4C: 188.0 ml LV SV MOD A2C:     53.0 ml LV SV MOD A4C:     209.0 ml LV SV MOD BP:      34.9 ml IVC IVC diam: 2.40 cm LEFT ATRIUM         Index LA diam:    4.30 cm 2.16 cm/m   AORTA Ao Root diam: 3.50 cm Ao Asc diam:  3.80 cm  SHUNTS Systemic Diam: 2.30 cm Riley Lam MD Electronically signed by Riley Lam MD Signature Date/Time: 07/11/2021/1:45:13 PM    Final     PHYSICAL EXAM General: Appears well-developed; acutely ill, intubated. Psych: Affect appropriate to situation Eyes: No scleral injection HENT: No OP obstrucion Head: Normocephalic.  Cardiovascular: Normal rate and regular rhythm.  Respiratory: Effort normal and breath sounds normal to anterior ascultation GI: Soft.  No distension. There is no tenderness.  Skin: WDI    Neurological Examination Mental Status: open eyes with tactile/verbal stimuli, but quickly closes and did not attend. Follows no commands. No speech due to ETT.  Cranial Nerves: II: OD pupil 52mm and non reactive. OS pupil 47mm and reactive.  III,IV, VI: Eyes midline.  V,VII: No corneal reflex on right. + on left. face is grossly symmetric with ETT. No reaction to brow pressure.  VIII: Opens right eye slightly on command with loud voice. Opens fully with noxious stimuli. No response to loud clap.  IX,X: + cough and gag with suctioning.  XI: head grossly midline.  XII: intubated.  Motor/Sensory: No withdrawal or movement to noxious stimuli to RUE. He holds up LUE with any stimuli to any body part, but not clearly purposeful. Withdraws to noxious stimuli slightly in RLE. LLE withdraws to noxious stimuli.  Tone and bulk:normal tone throughout; no atrophy noted. Deep Tendon Reflexes: Did not test due to COVID isolation.  Plantars: Right: downgoing                                Left: downgoing Cerebellar: No tremors, twitching, or shaking noted.  Gait: unable  ASSESSMENT/PLAN Mr.  Tristan Hart is a 85 y.o. male with history of CsHF, HTN, "pre DM II" on Jardiance, CKD IIIb, and HLD who presented to the ED  after a fall at home. His son had noticed patient to be confused with gait abnormality and an on and off facial droop x 3 days. Patient was also more sleepy than normal for him. Patient tested + for COVID infection on 07/01/21 and is still + here. Neurology was consulted  Stroke:  left MCA infarct embolic appearing; etiology unknown at this time; work up underway. COVID may be playing a role in hypercoagulable state.  CT head No acute abnormality. Small vessel disease. Atrophy.  MRI  evolving large LMCA stroke w/petechial hemorrhage. There is a intraluminal thrombus in the distal LMCA branch, possible LICA occlusion and remote lacunars in bilat BG/lt thalamus MRA  pending Carotid Doppler  pending 2D Echo 25% EF, LV wall abnormalities. LVH, no LV thrombus.  LDL 47 HgbA1c 8.0 VTE prophylaxis - SCDs, hold d/t bleed    Diet   Diet NPO time specified   none  prior to admission, now on ASA 81mg  with caution given some hemorrhagic conversion Therapy recommendations:  pending Disposition:  pending  Hypertension Home meds: Imdur 30mg , Toprol 50mg , Entresto, aldactone Unstable, req pressors at this time SBP goal give bleed is <140 Long-term BP goal normotensive  Hyperlipidemia Home meds:  Lipitor 40mg , not resumed in hospital given very low LDL and hemorrhage noted LDL 47, goal < 70  High intensity statin on hold for now Might Continue statin at discharge pending clinical course  Diabetes type II Uncontrolled Home meds:  glucotrol, jardiance HgbA1c 8.0, goal < 7.0 CBGs Recent Labs    07/11/21 0749 07/11/21 1134 07/11/21 1617  GLUCAP 190* 213* 179*    SSI  Other Stroke Risk Factors Advanced Age >/= 67  Obesity, Body mass index is 28.69 kg/m., BMI >/= 30 associated with increased stroke risk, recommend weight loss, diet and exercise as appropriate  Family  hx stroke (brother) Congestive heart failure  Other Active Problems CKD3b and AKI COVID+ Acute respiratory failure- decompensated and req intubation on 7/29 NSTEMI- +Trop, possibly type 2 Shock? TM 102.7, WBC 14.2, pressors needed  Hospital day # 2  Desiree Metzger-Cihelka, ARNP-C, ANVP-BC Pager: [phone]   Dr. 76 evaluated pt independently, reviewed imaging, chart, labs. Discussed and formulated plan. Please see NP note above for details.  To contact Stroke Continuity provider, please refer to 8/29. After hours, contact General Neurology

## 2021-07-11 NOTE — Progress Notes (Signed)
eLink Physician-Brief Progress Note Patient Name: Tristan Hart DOB: 06-Sep-1936 MRN: [id number]   Date of Service  07/11/2021  HPI/Events of Note  Notified of phos 1.9, K 3.7, Mg 1.9 Creatinine 1.87  eICU Interventions  Ordered Kphos 20 mmol        Rosalie Gums Xiamara Hulet 07/11/2021, 6:48 AM

## 2021-07-11 NOTE — Progress Notes (Signed)
Pt Peak pressuring on ventilator, this RN suctioned trach with in-line suction two times. Secretions became bloody. Pt respiratory status unchanged, RT made aware. Continue to monitor.

## 2021-07-11 NOTE — Progress Notes (Signed)
PHARMACY NOTE:  ANTIMICROBIAL RENAL DOSAGE ADJUSTMENT  Current antimicrobial regimen includes a mismatch between antimicrobial dosage and estimated renal function.  As per policy approved by the Pharmacy & Therapeutics and Medical Executive Committees, the antimicrobial dosage will be adjusted accordingly.  Current antimicrobial dosage:  Unasyn 3g q12h  Indication: aspiration pneumonia  Renal Function:  Estimated Creatinine Clearance: 31.9 mL/min (A) (by C-G formula based on SCr of 1.87 mg/dL (H)).    Antimicrobial dosage has been changed to:  Unasyn 3g q8h   Thank you for allowing pharmacy to be a part of this patient's care.  Georgeann Oppenheim, [facility] 07/11/2021 10:14 AM

## 2021-07-11 NOTE — Progress Notes (Signed)
OT Cancellation Note  Patient Details Name: Jarrah Babich MRN: [id number] DOB: October 09, 1936   Cancelled Treatment:    Reason Eval/Treat Not Completed: Medical issues which prohibited therapy;Patient not medically ready. Patient continues to be intubated and lightly sedated but not following commands. OT to hold at this time.   [name] OTR/L Supplemental OT, Department of rehab services [phone]  Anajah Sterbenz R H. 07/11/2021, 10:16 AM

## 2021-07-11 NOTE — Progress Notes (Signed)
NAME:  Tristan Hart, MRN:  [id number], DOB:  [birth year], LOS: 2 ADMISSION DATE:  Jul 24, 2021, CONSULTATION DATE:  7/28 REFERRING MD:  FPTS, CHIEF COMPLAINT:  respiratory failure    History of Present Illness:  85yo male with hx HTN, CHF, CKDIII who tested POS for COVID 7/20 (home test, unknown vaccination status) presented 7/28 with 3 days AMS, poor balance. He was seen in ER where initial w/u was essentially NEG with normal head CT, reassuring neuro exam and was being admitted by FPTS.  However while in ER he became agitated, then somnolent then had acute unresponsiveness and PCCM called urgently for intubation and ICU admission.   Pertinent  Medical History   has a past medical history of Congestive heart failure (CHF) (HCC), Hyperlipidemia, and Hypertension.   Significant Hospital Events: Including procedures, antibiotic start and stop dates in addition to other pertinent events   CT head 7/28>>neg acute  7/29 MRI demonstrated L MCA infarct and small lacunar infarcts   Interim History / Subjective:   Remains on levophed . No acute events overnight. Neuro requesting MRA brain.   Objective   Blood pressure 131/69, pulse 67, temperature 98.1 F (36.7 C), resp. rate (!) 22, height 5\' 8"  (1.727 m), weight 85.6 kg, SpO2 100 %.    Vent Mode: PSV;CPAP FiO2 (%):  [40 %-50 %] 40 % Set Rate:  [20 bmp] 20 bmp Vt Set:  [540 mL] 540 mL PEEP:  [5 cmH20] 5 cmH20 Pressure Support:  [12 cmH20] 12 cmH20 Plateau Pressure:  [17 cmH20-20 cmH20] 20 cmH20   Intake/Output Summary (Last 24 hours) at 07/11/2021 1501 Last data filed at 07/11/2021 0600 Gross per 24 hour  Intake --  Output 1030 ml  Net -1030 ml   Filed Weights   07/09/21 0930 07/09/21 2015  Weight: 84.3 kg 85.6 kg   Examination: General:  in bed, NAD, ill appearing, intubated HEENT: MM pink/moist, anicteric, trachea midline Neuro: not alert to verbal stimuli, withdraws to pain CV: S1S2, SR , no m/r/g appreciated PULM:   clear to auscultation GI: soft, bsx4 active, non distended Extremities: no edema, warm Skin: stage 1 on coccyx, no rashes or lesions noted  Resolved Hospital Problem list     Assessment & Plan:  Acute respiratory failure with hypoxemia Aspiration Pneumonia- RLL infiltrate on CXR COVID infection  P: -LTVV strategy with tidal volumes of 4-8 cc/kg ideal body weight -Goal plateau pressures less than 30 and driving pressures of less than 15 -Wean PEEP/FiO2 for SpO2 92-98% -Follow intermittent CXR and ABG PRN -VAP bundle -Daily SAT and SBT -Not currently on sedation -Unasyn for aspiration pneumonia  Left MCA infarct, remote lacunar infarcts, small petechial hemorrhages - as seen on 7/29 MRI brain AMS  P: -MRA and Carotid 8/29 ordered as recommended by neuro. AED per neurology - aspirain + statin -Continue neuroprotective measures -Continue aspiration precautions -Start nutrition -Not currently on sedation.   Shock- ?sepsis vs cardiogenic vs mixed, +COVID, CHF hx, Tmax 102.7, WBC 14.2 Troponin elevation- [phone]. Suspect demand. No obvious ST changes seen on 12 lead. Lactate 1.4>0.8 -Continue levophed, Goal SBP 120-140 -Continue trending troponin -Unable to anticoagulate due to L MCA infarct with petechial hemorrhage.  - Lasix 40mg  IV once  Hx of HFrEF -Echo 02/18/2021 with EF 25-30% and grade 2 diastolic dysfunction, he follow Dr. . Repeat echo 7/29 showing same EF. Hx of HTN/HLD -Home medications include lipitor, lasix, imdur, toprol-xl, entresto, and aldactone  P: -continue tele -holding home GDMT and antihypertensives due  to pressor requirements -Strict I and O -Goal K above 4, goal MG above 2 -On ASA and statin  AKI superimposed on CKD stage IIIb -Baseline creatinine 1.8, creatinine on admission 2.27> 2.02, P: -Ensure renal perfusion. Goal MAP 65 or greater. -Avoid neprotoxic drugs as possible. -Strict I&O's -Follow up AM creatinine -Continue  foley  Hx of diabetes  -Home medications include; glipizide and Jardiance  P: -Blood Glucose goal 140-180. -Start SSI  -holding home medications  Best Practice   Diet/type: tubefeeds DVT prophylaxis: SCD GI prophylaxis: PPI Lines: N/A Foley:  Yes, and it is still needed Code Status:  full code Last date of multidisciplinary goals of care discussion: Pending, plan on updating son and GOC discussion today.    Critical care time: 40 minutes   Melody Comas, MD [facility] Office: [phone]   See Amion for personal pager PCCM on call pager [phone] until 7pm. Please call Elink 7p-7a. [phone]

## 2021-07-11 NOTE — Progress Notes (Signed)
PT Cancellation Note  Patient Details Name: Tristan Hart MRN: [id number] DOB: 11-Oct-1936   Cancelled Treatment:    Reason Eval/Treat Not Completed: Other (comment) Pt remains intubated and demo's no command follow on light sedation. Pt unable to participate in therapy at this time. PT to return as able, as appropriate to complete PT eval.  Lewis Shock, PT, DPT Acute Rehabilitation Services Pager #: [phone] Office #: [phone]    Iona Hansen 07/11/2021, 11:30 AM

## 2021-07-11 NOTE — Plan of Care (Signed)
  Problem: Safety: Goal: Non-violent Restraint(s) Outcome: Progressing   Problem: Education: Goal: Knowledge of General Education information will improve Description: Including pain rating scale, medication(s)/side effects and non-pharmacologic comfort measures Outcome: Progressing   Problem: Health Behavior/Discharge Planning: Goal: Ability to manage health-related needs will improve Outcome: Progressing   Problem: Clinical Measurements: Goal: Ability to maintain clinical measurements within normal limits will improve Outcome: Progressing Goal: Will remain free from infection Outcome: Progressing Goal: Diagnostic test results will improve Outcome: Progressing Goal: Respiratory complications will improve Outcome: Progressing Goal: Cardiovascular complication will be avoided Outcome: Progressing   

## 2021-07-12 ENCOUNTER — Inpatient Hospital Stay (HOSPITAL_COMMUNITY): Payer: Medicare HMO

## 2021-07-12 DIAGNOSIS — I639 Cerebral infarction, unspecified: Secondary | ICD-10-CM

## 2021-07-12 DIAGNOSIS — I63232 Cerebral infarction due to unspecified occlusion or stenosis of left carotid arteries: Secondary | ICD-10-CM

## 2021-07-12 DIAGNOSIS — G463 Brain stem stroke syndrome: Secondary | ICD-10-CM

## 2021-07-12 DIAGNOSIS — I63512 Cerebral infarction due to unspecified occlusion or stenosis of left middle cerebral artery: Secondary | ICD-10-CM | POA: Diagnosis not present

## 2021-07-12 LAB — CBC WITH DIFFERENTIAL/PLATELET
Abs Immature Granulocytes: 0.06 10*3/uL (ref 0.00–0.07)
Basophils Absolute: 0 10*3/uL (ref 0.0–0.1)
Basophils Relative: 0 %
Eosinophils Absolute: 0.2 10*3/uL (ref 0.0–0.5)
Eosinophils Relative: 2 %
HCT: 35.9 % — ABNORMAL LOW (ref 39.0–52.0)
Hemoglobin: 12 g/dL — ABNORMAL LOW (ref 13.0–17.0)
Immature Granulocytes: 1 %
Lymphocytes Relative: 6 %
Lymphs Abs: 0.7 10*3/uL (ref 0.7–4.0)
MCH: 31.2 pg (ref 26.0–34.0)
MCHC: 33.4 g/dL (ref 30.0–36.0)
MCV: 93.2 fL (ref 80.0–100.0)
Monocytes Absolute: 0.6 10*3/uL (ref 0.1–1.0)
Monocytes Relative: 5 %
Neutro Abs: 10.2 10*3/uL — ABNORMAL HIGH (ref 1.7–7.7)
Neutrophils Relative %: 86 %
Platelets: 236 10*3/uL (ref 150–400)
RBC: 3.85 MIL/uL — ABNORMAL LOW (ref 4.22–5.81)
RDW: 13.3 % (ref 11.5–15.5)
WBC: 11.8 10*3/uL — ABNORMAL HIGH (ref 4.0–10.5)
nRBC: 0 % (ref 0.0–0.2)

## 2021-07-12 LAB — COMPREHENSIVE METABOLIC PANEL
ALT: 27 U/L (ref 0–44)
ALT: 26 U/L (ref 0–44)
AST: 30 U/L (ref 15–41)
AST: 28 U/L (ref 15–41)
Albumin: 2.1 g/dL — ABNORMAL LOW (ref 3.5–5.0)
Albumin: 2.3 g/dL — ABNORMAL LOW (ref 3.5–5.0)
Alkaline Phosphatase: 58 U/L (ref 38–126)
Alkaline Phosphatase: 71 U/L (ref 38–126)
Anion gap: 10 (ref 5–15)
Anion gap: 10 (ref 5–15)
BUN: 38 mg/dL — ABNORMAL HIGH (ref 8–23)
BUN: 38 mg/dL — ABNORMAL HIGH (ref 8–23)
CO2: 21 mmol/L — ABNORMAL LOW (ref 22–32)
CO2: 25 mmol/L (ref 22–32)
Calcium: 8.8 mg/dL — ABNORMAL LOW (ref 8.9–10.3)
Calcium: 9 mg/dL (ref 8.9–10.3)
Chloride: 110 mmol/L (ref 98–111)
Chloride: 105 mmol/L (ref 98–111)
Creatinine, Ser: 1.69 mg/dL — ABNORMAL HIGH (ref 0.61–1.24)
Creatinine, Ser: 1.72 mg/dL — ABNORMAL HIGH (ref 0.61–1.24)
GFR, Estimated: 40 mL/min — ABNORMAL LOW (ref >60)
GFR, Estimated: 39 mL/min — ABNORMAL LOW (ref >60)
Glucose, Bld: 193 mg/dL — ABNORMAL HIGH (ref 70–99)
Glucose, Bld: 204 mg/dL — ABNORMAL HIGH (ref 70–99)
Potassium: 3.5 mmol/L (ref 3.5–5.1)
Potassium: 3.7 mmol/L (ref 3.5–5.1)
Sodium: 141 mmol/L (ref 135–145)
Sodium: 140 mmol/L (ref 135–145)
Total Bilirubin: 1.7 mg/dL — ABNORMAL HIGH (ref 0.3–1.2)
Total Bilirubin: 1.3 mg/dL — ABNORMAL HIGH (ref 0.3–1.2)
Total Protein: 5.3 g/dL — ABNORMAL LOW (ref 6.5–8.1)
Total Protein: 5.7 g/dL — ABNORMAL LOW (ref 6.5–8.1)

## 2021-07-12 LAB — GLUCOSE, CAPILLARY
Glucose-Capillary: 178 mg/dL — ABNORMAL HIGH (ref 70–99)
Glucose-Capillary: 177 mg/dL — ABNORMAL HIGH (ref 70–99)
Glucose-Capillary: 188 mg/dL — ABNORMAL HIGH (ref 70–99)
Glucose-Capillary: 190 mg/dL — ABNORMAL HIGH (ref 70–99)
Glucose-Capillary: 145 mg/dL — ABNORMAL HIGH (ref 70–99)
Glucose-Capillary: 198 mg/dL — ABNORMAL HIGH (ref 70–99)

## 2021-07-12 LAB — PHOSPHORUS: Phosphorus: 2.2 mg/dL — ABNORMAL LOW (ref 2.5–4.6)

## 2021-07-12 LAB — MAGNESIUM: Magnesium: 1.7 mg/dL (ref 1.7–2.4)

## 2021-07-12 IMAGING — MR MR MRA HEAD W/O CM
2 series · 17 of 48 positions shown · non-contrast
Comparison: Brain MRI [DATE].
COMPARISON: Brain MRI [DATE].

Addendum:
CLINICAL DATA: 85-year-old male with large left MCA infarct and
absent left ICA flow void on brain MRI [DATE].

Positive for [T1] on [DATE], intubated.
EXAM:
MRA HEAD WITHOUT CONTRAST
TECHNIQUE: Angiographic images of the Circle of Willis were acquired using MRA
technique without intravenous contrast.

[Series 2: ax (id) · axial · 1.0mm · 0.43mm/px · z∈[-23,+58]mm · 16 of 176 slices shown]
[im 1/176]
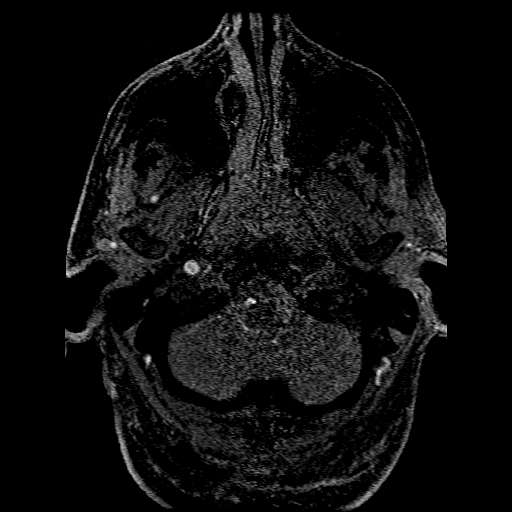
[im 4/176]
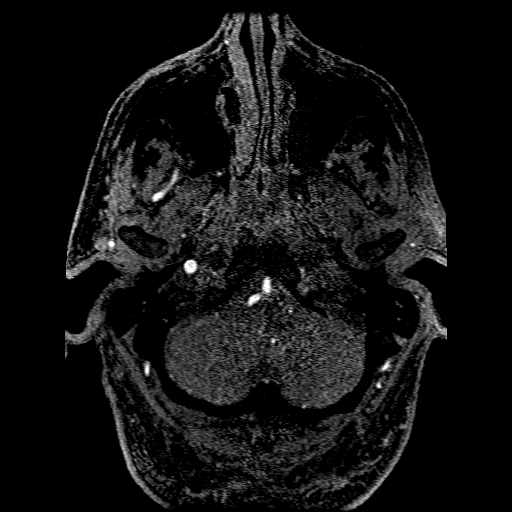
[im 8/176]
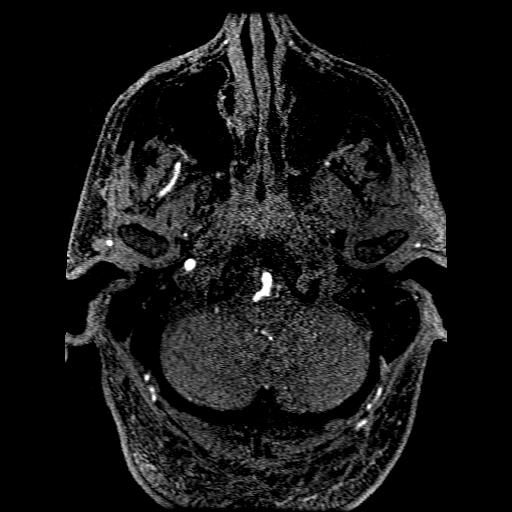
[im 12/176]
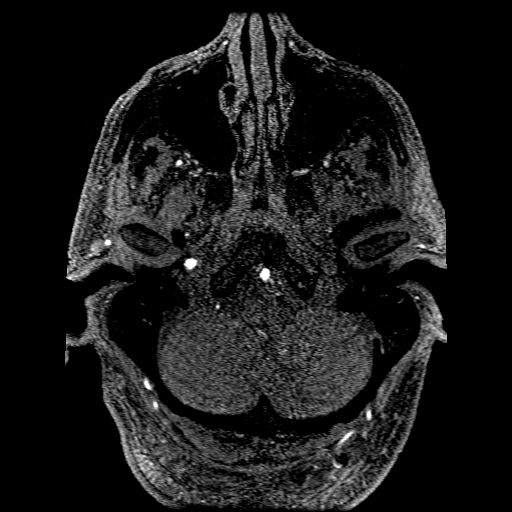
[im 16/176]
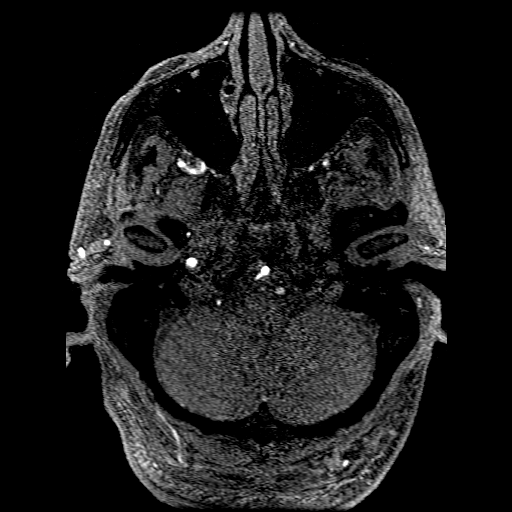
[im 20/176]
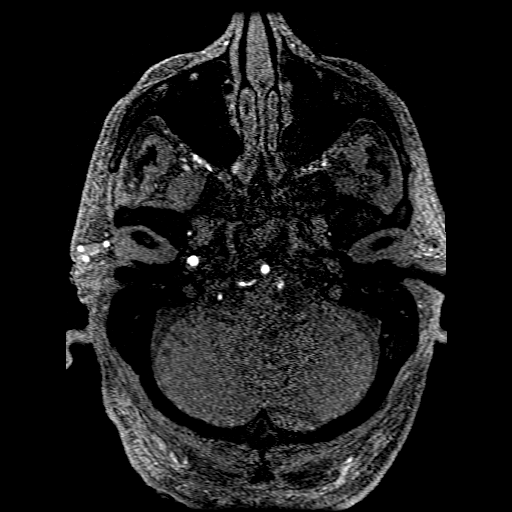
[im 27/176]
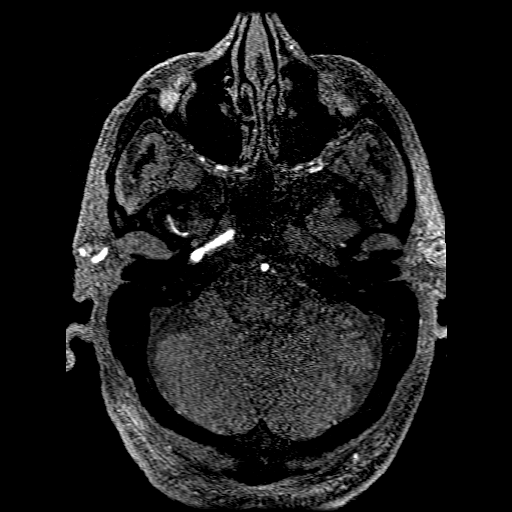
[im 31/176]
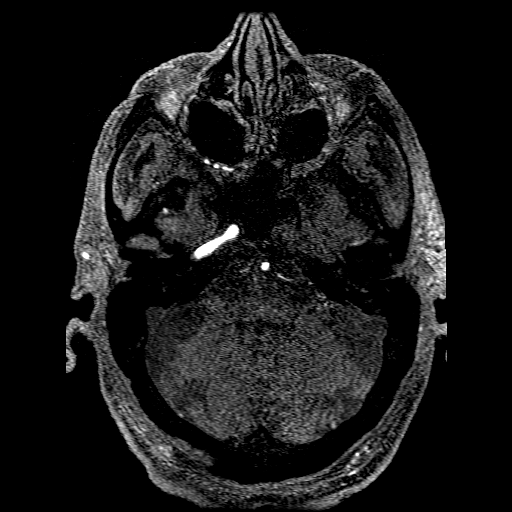
[im 54/176]
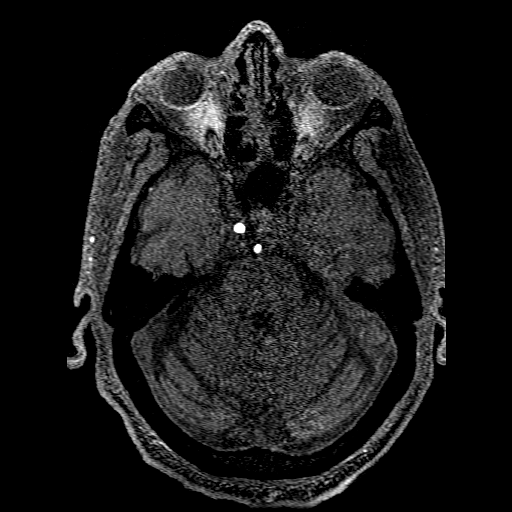
[im 77/176]
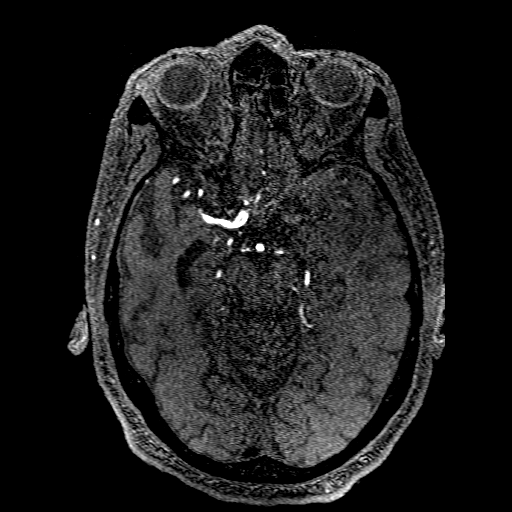
[im 88/176]
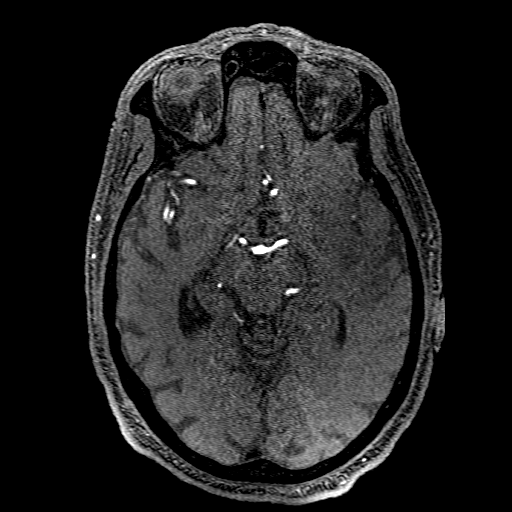
[im 99/176]
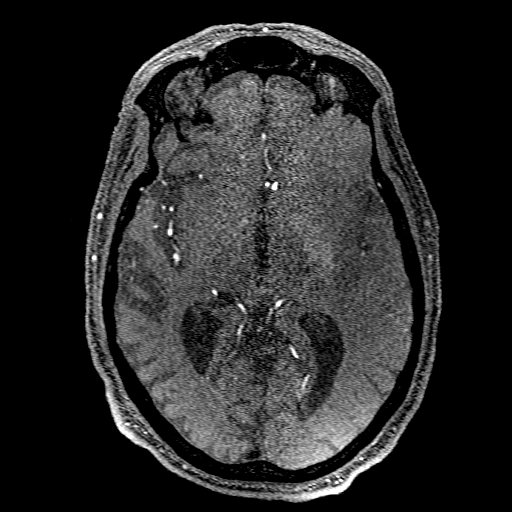
[im 122/176]
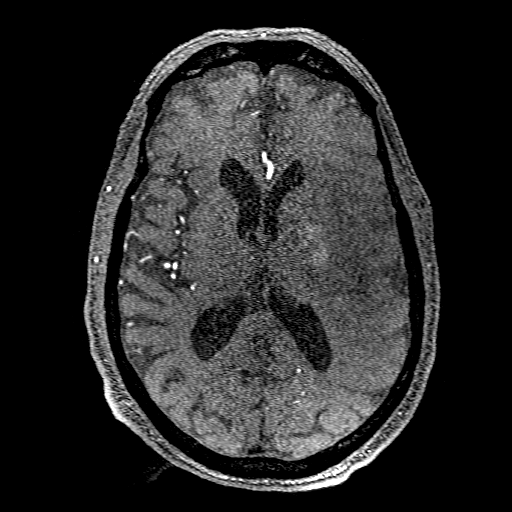
[im 145/176]
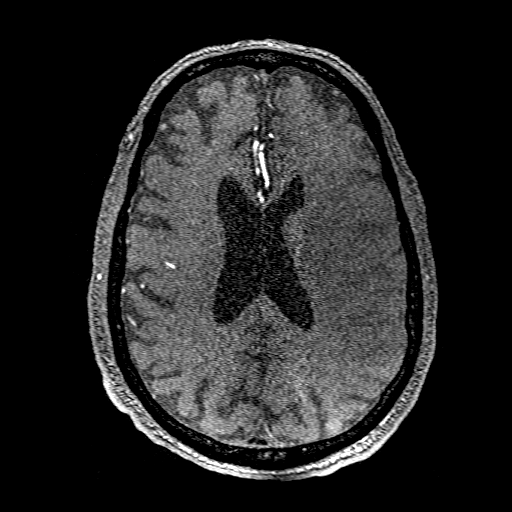
[im 149/176]
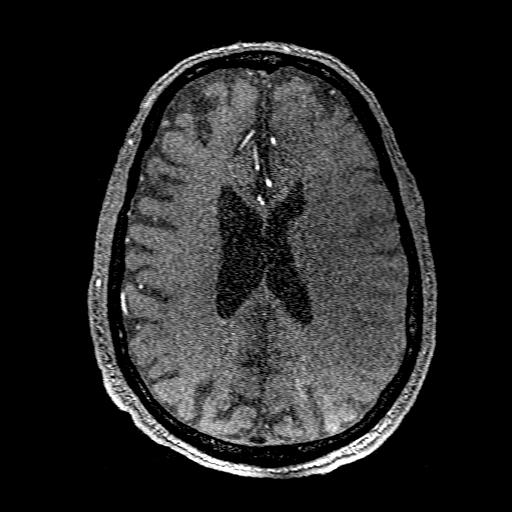
[im 168/176]
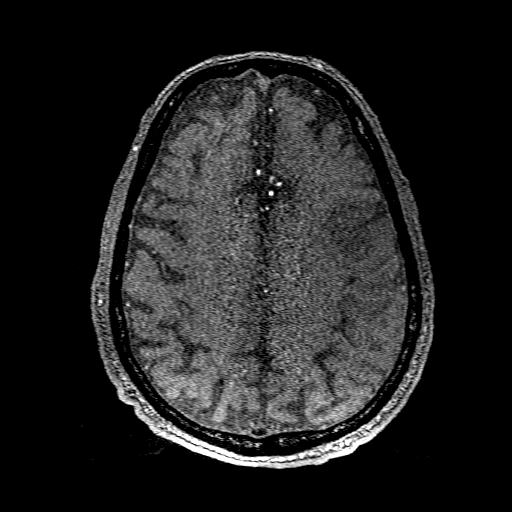

[Series 201: pjn:ax (id) · sagittal · 1.0mm · 0.43mm/px · 1 of 4 slices shown]
[im 1/4]
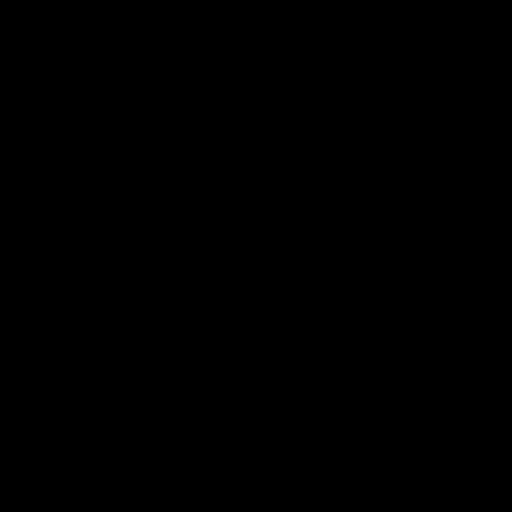

[17 of 48 positions shown; findings below may reference images not displayed]

FINDINGS: Mild mass effect now on the left lateral ventricle. But no
intracranial midline shift (series 2, image 134). Loss of sulci in
the left MCA territory.

Antegrade flow in the basilar artery, but there appears to be loss
of flow signal in the distal left vertebral artery, which also had a
partially abnormal flow void on the recent MRI. The distal right
vertebral artery appears patent along with the right vertebrobasilar
junction. Mid basilar artery irregularity is moderate, but with only
mild mid basilar stenosis. Distal basilar is patent. SCA and PCA
origins are patent. Posterior communicating arteries are diminutive
or absent. Right PCA branches are within normal limits. There is
mild to moderate irregularity and stenosis of the left PCA P1 and
P2. Preserved distal left PCA flow signal.

Absent flow in the left ICA siphon, left ICA terminus, left MCA, and
left ACA origin.

The distal left A1 and the left A2 appear supplied via the anterior
communicating artery. There is fairly symmetric bilateral ACA branch
flow signal.

Right ICA siphon is patent with antegrade flow to the right ICA
terminus. There is moderate stenosis of the right ICA supraclinoid
segment on series 255, image 15. Patent right MCA and ACA origins.
Normal right ACA A1. Right MCA M1 segment and bifurcation are
patent. Visible right MCA branches are within normal limits.
IMPRESSION: 1. Confirmed Large Vessel occlusion of the Left ICA. This is at
T-occlusion, with associated Left MCA and ACA origins occluded. No
reconstitution of the MCA. The Left ACA is reconstituted via the
A-comm.

2. Occluded distal Left Vertebral Artery also, also evident on the
recent MRI and likely chronic given the absence of posterior
circulation ischemia on that exam.

3. Evidence of underlying intracranial atherosclerosis, with up to
moderate stenosis of the distal Right ICA siphon, mild mid Basilar
and mild to moderate Left PCA stenoses.

ADDENDUM:
Study discussed by telephone with Stroke Team NP ARIKAN on
[DATE] at [T1] hours.

*** End of Addendum ***
FINDINGS: Mild mass effect now on the left lateral ventricle. But no
intracranial midline shift (series 2, image 134). Loss of sulci in
the left MCA territory.

Antegrade flow in the basilar artery, but there appears to be loss
of flow signal in the distal left vertebral artery, which also had a
partially abnormal flow void on the recent MRI. The distal right
vertebral artery appears patent along with the right vertebrobasilar
junction. Mid basilar artery irregularity is moderate, but with only
mild mid basilar stenosis. Distal basilar is patent. SCA and PCA
origins are patent. Posterior communicating arteries are diminutive
or absent. Right PCA branches are within normal limits. There is
mild to moderate irregularity and stenosis of the left PCA P1 and
P2. Preserved distal left PCA flow signal.

Absent flow in the left ICA siphon, left ICA terminus, left MCA, and
left ACA origin.

The distal left A1 and the left A2 appear supplied via the anterior
communicating artery. There is fairly symmetric bilateral ACA branch
flow signal.

Right ICA siphon is patent with antegrade flow to the right ICA
terminus. There is moderate stenosis of the right ICA supraclinoid
segment on series 255, image 15. Patent right MCA and ACA origins.
Normal right ACA A1. Right MCA M1 segment and bifurcation are
patent. Visible right MCA branches are within normal limits.
IMPRESSION: 1. Confirmed Large Vessel occlusion of the Left ICA. This is at
T-occlusion, with associated Left MCA and ACA origins occluded. No
reconstitution of the MCA. The Left ACA is reconstituted via the
A-comm.

2. Occluded distal Left Vertebral Artery also, also evident on the
recent MRI and likely chronic given the absence of posterior
circulation ischemia on that exam.

3. Evidence of underlying intracranial atherosclerosis, with up to
moderate stenosis of the distal Right ICA siphon, mild mid Basilar
and mild to moderate Left PCA stenoses.

## 2021-07-12 MED ORDER — POTASSIUM PHOSPHATES 15 MMOLE/5ML IV SOLN
15.0000 mmol | Freq: Once | INTRAVENOUS | Status: AC
  Administered 2021-07-12: 15 mmol via INTRAVENOUS
  Filled 2021-07-12: qty 5

## 2021-07-12 MED ORDER — POTASSIUM CHLORIDE 20 MEQ PO PACK
40.0000 meq | PACK | Freq: Once | ORAL | Status: AC
  Administered 2021-07-12: 40 meq
  Filled 2021-07-12: qty 2

## 2021-07-12 MED ORDER — MAGNESIUM SULFATE 2 GM/50ML IV SOLN
2.0000 g | Freq: Once | INTRAVENOUS | Status: AC
  Administered 2021-07-12: 2 g via INTRAVENOUS
  Filled 2021-07-12: qty 50

## 2021-07-12 MED ORDER — FUROSEMIDE 10 MG/ML IJ SOLN
40.0000 mg | Freq: Once | INTRAMUSCULAR | Status: AC
  Administered 2021-07-12: 40 mg via INTRAVENOUS
  Filled 2021-07-12: qty 4

## 2021-07-12 NOTE — Progress Notes (Signed)
eLink Physician-Brief Progress Note Patient Name: Tristan Hart DOB: 1936-03-05 MRN: [id number]   Date of Service  07/12/2021  HPI/Events of Note  BMP and mag , phos noted  eICU Interventions  15 mmol of K phos (which should give around 20 meq K) and 2 gram mag ordered Would repeat labs later and trend      Intervention Category Major Interventions: Electrolyte abnormality - evaluation and management  Oretha Milch 07/12/2021, 6:39 AM

## 2021-07-12 NOTE — Progress Notes (Signed)
Patient ID: Tristan Hart, male   DOB: 02-Jan-1936, 85 y.o.   MRN: [id number] STROKE TEAM PROGRESS NOTE   INTERVAL HISTORY No one is at the bedside. He is in COVID isolation. He is on no sedation. No events overnight. Stable per nurse. Reaching out with left hand intermittently.  Vitals:   07/12/21 1400 07/12/21 1415 07/12/21 1430 07/12/21 1445  BP: 106/82 120/60 121/61 112/62  Pulse: (!) 51 (!) 54 (!) 53 (!) 57  Resp: 20 18 20 20   Temp:      TempSrc:      SpO2: 100% 100% 100% 100%  Weight:      Height:       CBC:  Recent Labs  Lab 07-31-2021 2236 07/09/21 1540 07/11/21 0509 07/12/21 0556  WBC 6.0   < > 21.0* 11.8*  NEUTROABS 4.6  --   --  10.2*  HGB 11.8*   < > 12.6* 12.0*  HCT 35.1*   < > 37.2* 35.9*  MCV 100.0   < > 93.9 93.2  PLT 319   < > 297 236   < > = values in this interval not displayed.   Basic Metabolic Panel:  Recent Labs  Lab 07/11/21 0509 07/11/21 1655 07/12/21 0556  NA 140  --  141  K 3.7  --  3.5  CL 113*  --  110  CO2 21*  --  21*  GLUCOSE 197*  --  193*  BUN 39*  --  38*  CREATININE 1.87*  --  1.69*  CALCIUM 8.9  --  8.8*  MG 1.9 1.9 1.7  PHOS 1.9* 2.7 2.2*   Lipid Panel: No results for input(s): CHOL, TRIG, HDL, CHOLHDL, VLDL, LDLCALC in the last 168 hours. HgbA1c:  Recent Labs  Lab 07/10/21 0848  HGBA1C 8.0*   Urine Drug Screen: No results for input(s): LABOPIA, COCAINSCRNUR, LABBENZ, AMPHETMU, THCU, LABBARB in the last 168 hours.  Alcohol Level No results for input(s): ETH in the last 168 hours.  IMAGING past 24 hours MR ANGIO HEAD WO CONTRAST  Addendum Date: 07/12/2021   ADDENDUM REPORT: 07/12/2021 11:43 ADDENDUM: Study discussed by telephone with Stroke Team NP Metzger-Cihelka on 07/12/2021 at 1120 hours. Electronically Signed   By: 07/26/2021 M.D.   On: 07/12/2021 11:43   Result Date: 07/12/2021 CLINICAL DATA:  85 year old male with large left MCA infarct and absent left ICA flow void on brain MRI 07/09/2021. Positive for COVID-19 on  07/01/2021, intubated. EXAM: MRA HEAD WITHOUT CONTRAST TECHNIQUE: Angiographic images of the Circle of Willis were acquired using MRA technique without intravenous contrast. COMPARISON:  Brain MRI 07/09/2021. FINDINGS: Mild mass effect now on the left lateral ventricle. But no intracranial midline shift (series 2, image 134). Loss of sulci in the left MCA territory. Antegrade flow in the basilar artery, but there appears to be loss of flow signal in the distal left vertebral artery, which also had a partially abnormal flow void on the recent MRI. The distal right vertebral artery appears patent along with the right vertebrobasilar junction. Mid basilar artery irregularity is moderate, but with only mild mid basilar stenosis. Distal basilar is patent. SCA and PCA origins are patent. Posterior communicating arteries are diminutive or absent. Right PCA branches are within normal limits. There is mild to moderate irregularity and stenosis of the left PCA P1 and P2. Preserved distal left PCA flow signal. Absent flow in the left ICA siphon, left ICA terminus, left MCA, and left ACA origin. The distal left A1  and the left A2 appear supplied via the anterior communicating artery. There is fairly symmetric bilateral ACA branch flow signal. Right ICA siphon is patent with antegrade flow to the right ICA terminus. There is moderate stenosis of the right ICA supraclinoid segment on series 255, image 15. Patent right MCA and ACA origins. Normal right ACA A1. Right MCA M1 segment and bifurcation are patent. Visible right MCA branches are within normal limits. IMPRESSION: 1. 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 This is at T-occlusion, with associated Left MCA and ACA origins occluded. No reconstitution of the MCA. The Left ACA is reconstituted via the A-comm. 2. Occluded distal Left Vertebral Artery also, also evident on the recent MRI and likely chronic given the absence of posterior circulation ischemia on that  exam. 3. Evidence of underlying intracranial atherosclerosis, with up to moderate stenosis of the distal Right ICA siphon, mild mid Basilar and mild to moderate Left PCA stenoses. Electronically Signed: By: Odessa FlemingH  Hall M.D. On: 07/12/2021 11:19   VAS US CAROTID  Result Date: 07/12/2021 Carotid Arterial Duplex Study Patient Name:  Tristan Hart  Date of Exam:   07/12/2021 Medical Rec #: [id number]      Accession #:    [id number] Date of Birth: 1936/11/23      Patient Gender: M Patient Age:   85Y [facility] Procedure:      VAS US CAROTID Referring Phys: [id number] Martina SinnerJONATHAN B DEWALD --------------------------------------------------------------------------------  Indications:       CVA. Risk Factors:      Hypertension, hyperlipidemia. Other Factors:     COVID. Limitations        Today's exam was limited due to- patient intubated with head                    leaning to left side making left ICA interrogation                    challenging. Comparison Study:  No prior study Performing Technologist: Gertie FeyMichelle Simonetti MHA, RDMS, RVT, RDCS  Examination Guidelines: A complete evaluation includes B-mode imaging, spectral Doppler, color Doppler, and power Doppler as needed of all accessible portions of each vessel. Bilateral testing is considered an integral part of a complete examination. Limited examinations for reoccurring indications may be performed as noted.  Right Carotid Findings: +----------+--------+--------+--------+--------------------------+--------+           PSV cm/sEDV cm/sStenosisPlaque Description        Comments +----------+--------+--------+--------+--------------------------+--------+ CCA Prox  83      10              focal and heterogenous             +----------+--------+--------+--------+--------------------------+--------+ CCA Distal80      13              heterogenous and irregular          +----------+--------+--------+--------+--------------------------+--------+ ICA Prox  236     48      40-59%  smooth and heterogenous            +----------+--------+--------+--------+--------------------------+--------+ ICA Mid   192     29                                                 +----------+--------+--------+--------+--------------------------+--------+ ICA Distal109     20                                                 +----------+--------+--------+--------+--------------------------+--------+  ECA       232     32      >50%    calcific                           +----------+--------+--------+--------+--------------------------+--------+ +----------+--------+-------+----------------+-------------------+           PSV cm/sEDV cmsDescribe        Arm Pressure (mmHG) +----------+--------+-------+----------------+-------------------+ [id number]            Multiphasic, WNL                    +----------+--------+-------+----------------+-------------------+ +---------+--------+--+--------+--+---------+ VertebralPSV cm/s72EDV cm/s16Antegrade +---------+--------+--+--------+--+---------+  Left Carotid Findings: +----------+--------+--------+--------+------------------+--------+           PSV cm/sEDV cm/sStenosisPlaque DescriptionComments +----------+--------+--------+--------+------------------+--------+ CCA Prox  42                                                 +----------+--------+--------+--------+------------------+--------+ CCA Distal17                                                 +----------+--------+--------+--------+------------------+--------+ ICA Prox  12                                                 +----------+--------+--------+--------+------------------+--------+ ICA Distal12                                                 +----------+--------+--------+--------+------------------+--------+ ECA       44                                                  +----------+--------+--------+--------+------------------+--------+ +----------+--------+--------+----------------+-------------------+           PSV cm/sEDV cm/sDescribe        Arm Pressure (mmHG) +----------+--------+--------+----------------+-------------------+ Subclavian129             Multiphasic, WNL                    +----------+--------+--------+----------------+-------------------+ +---------+--------+--+--------+-+---------+ VertebralPSV cm/s37EDV cm/s3Antegrade +---------+--------+--+--------+-+---------+ No diastolic flow with dampened systolic flow (thumped waveforms) throughout left carotid artery system.  Summary: Right Carotid: Velocities in the right ICA are consistent with a 40-59%                stenosis. Left Carotid: Limited evaluation of left ICA due to patient position. Left ICA               waveforms are suggestive of distal occlusion or near-occlusion. Vertebrals:  Bilateral vertebral arteries demonstrate antegrade flow. Subclavians: Normal flow hemodynamics were seen in bilateral subclavian              arteries. *See table(s) above for measurements and observations.  Electronically signed by Waverly Ferrari MD on 07/12/2021 at 1:31:16 PM.    Final  PHYSICAL EXAM General: Appears well-developed; acutely ill, intubated. Psych: Affect appropriate to situation Eyes: No scleral injection HENT: No OP obstrucion Head: Normocephalic.  Cardiovascular: Normal rate and regular rhythm.  Respiratory: Effort normal and breath sounds normal to anterior ascultation GI: Soft.  No distension. There is no tenderness.  Skin: WDI    Neurological Examination Mental Status: Not oriented. Non verbal. Appears agitated.reaching out with left hand intermittently.  Does not follow commands. No speech due to ETT.  Cranial Nerves: II: Right pupil 10mm and non reactive. left pupil 14mm and reactive.  III,IV, VI: Eyes midline.   V,VII: No corneal reflex on right. + on left. face is grossly symmetric with ETT. No reaction to brow pressure.  VIII: Opens right eye slightly on command with loud voice. Opens fully with noxious stimuli. No response to loud clap.  IX,X: + cough and gag with suctioning.  XI: head grossly midline.  XII: intubated.  Motor/Sensory: No withdrawal or movement to noxious stimuli to RUE. He holds up LUE with any stimuli to any body part, but not clearly purposeful. Withdraws to noxious stimuli slightly in RLE. LLE withdraws to noxious stimuli.  Tone and bulk:normal tone throughout; no atrophy noted. Deep Tendon Reflexes: Did not test due to COVID isolation.  Plantars: Right: downgoing                                Left: downgoing Cerebellar: No tremors, twitching, or shaking noted.  Gait: unable  ASSESSMENT/PLAN Mr. Rayce Brahmbhatt is a 85 y.o. male with history of CsHF, HTN, "pre DM II" on Jardiance, CKD IIIb, and HLD who presented to the ED  after a fall at home. His son had noticed patient to be confused with gait abnormality and an on and off facial droop x 3 days. Patient was also more sleepy than normal for him. Patient tested + for COVID infection on 07/01/21 and is still + here.   Stroke:  left MCA infarct embolic appearing; etiology left ICA occlussion.  COVID may be playing a role in hypercoagulable state.  CT head No acute abnormality. Small vessel disease. Atrophy.  MRI  evolving large LMCA stroke w/petechial hemorrhage. There is a intraluminal thrombus in the distal LMCA branch, possible LICA occlusion and remote lacunars in bilat BG/lt thalamus MRA  1. 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 This is atT-occlusion, with associated Left MCA and ACA origins occluded. No reconstitution of the MCA. The Left ACA is reconstituted via the A-comm.Occluded distal Left Vertebral Artery also, also evident on the Evidence of underlying intracranial atherosclerosis, with up to moderate  stenosis of the distal Right ICA siphon, mild mid Basilar and mild to moderate Left PCA stenoses.  His MRA and size of stroke is indictative of poor prognosis. Recommend palliative care consult when family is ready.  Carotid Doppler  Left ICA occlussion. 2D Echo 25% EF, LV wall abnormalities. LVH, no LV thrombus.  LDL 47 HgbA1c 8.0 VTE prophylaxis - SCDs, hold d/t bleed    Diet   Diet NPO time specified   none  prior to admission, now on ASA 81mg  with caution given some hemorrhagic conversion Therapy recommendations:  pending Disposition:  pending Will sign off, call neurology if further input is needed.  Hypertension Home meds: Imdur 30mg , Toprol 50mg , Entresto, aldactone Unstable, req pressors at this time SBP goal give bleed is <140 Long-term BP goal normotensive  Hyperlipidemia Home meds:  Lipitor 40mg , not resumed in hospital given very low LDL and hemorrhage noted LDL 47, goal < 70  High intensity statin on hold for now Might Continue statin at discharge pending clinical course  Diabetes type II Uncontrolled Home meds:  glucotrol, jardiance HgbA1c 8.0, goal < 7.0 CBGs Recent Labs    07/12/21 0339 07/12/21 0705 07/12/21 1137  GLUCAP 177* 188* 190*    SSI  Other Stroke Risk Factors Advanced Age >/= 65  Obesity, Body mass index is 29.2 kg/m., BMI >/= 30 associated with increased stroke risk, recommend weight loss, diet and exercise as appropriate  Family hx stroke (brother) Congestive heart failure  Other Active Problems CKD3b and AKI COVID+ Acute respiratory failure- decompensated and req intubation on 7/29 NSTEMI- +Trop, possibly type 2   Hospital day # 3  8/29, MD  To contact Stroke Continuity provider, please refer to Leticia Penna. After hours, contact General Neurology

## 2021-07-12 NOTE — Progress Notes (Signed)
NAME:  Tristan Hart, MRN:  [id number], DOB:  [birth year], LOS: 3 ADMISSION DATE:  07/02/2021, CONSULTATION DATE:  7/28 REFERRING MD:  FPTS, CHIEF COMPLAINT:  respiratory failure    History of Present Illness:  85yo male with hx HTN, CHF, CKDIII who tested POS for COVID 7/20 (home test, unknown vaccination status) presented 7/28 with 3 days AMS, poor balance. He was seen in ER where initial w/u was essentially NEG with normal head CT, reassuring neuro exam and was being admitted by FPTS.  However while in ER he became agitated, then somnolent then had acute unresponsiveness and PCCM called urgently for intubation and ICU admission.   Pertinent  Medical History   has a past medical history of Congestive heart failure (CHF) (HCC), Hyperlipidemia, and Hypertension.   Significant Hospital Events: Including procedures, antibiotic start and stop dates in addition to other pertinent events   CT head 7/28>>neg acute  7/29 MRI demonstrated L MCA infarct and small lacunar infarcts 7/31 Carotid Doppler noted near occlusion of left carotid artery   Interim History / Subjective:   Levophed weaning down with diuresis. Mental status still limited.  Updated patient's son this morning that MRA brain was pending. We discussed long term outlook and he stated his father would not want tracheostomy.   Carotid Doppler this AM was concerning for near total occlusion of left carotid artery. Vascular Surgery has been consulted.    Objective   Blood pressure 121/76, pulse (!) 46, temperature 98.2 F (36.8 C), resp. rate 20, height 5\' 8"  (1.727 m), weight 87.1 kg, SpO2 100 %.    Vent Mode: PRVC FiO2 (%):  [40 %] 40 % Set Rate:  [20 bmp] 20 bmp Vt Set:  [540 mL] 540 mL PEEP:  [5 cmH20] 5 cmH20 Pressure Support:  [12 cmH20] 12 cmH20 Plateau Pressure:  [15 cmH20-20 cmH20] 17 cmH20   Intake/Output Summary (Last 24 hours) at 07/12/2021 1050 Last data filed at 07/12/2021 08/01/2021 Gross per 24 hour  Intake 2013.32  ml  Output 4050 ml  Net -2036.68 ml   Filed Weights   07/09/21 0930 07/09/21 2015 07/12/21 0322  Weight: 84.3 kg 85.6 kg 87.1 kg   Examination: General:  NAD, ill appearing, intubated HEENT: MM pink/moist, anicteric, trachea midline Neuro: not alert to verbal stimuli, withdraws to pain CV: S1S2, SR , no m/r/g appreciated PULM:  clear to auscultation GI: soft, bsx4 active, non distended Extremities: no edema, warm Skin: stage 1 on coccyx, no rashes or lesions noted  Resolved Hospital Problem list     Assessment & Plan:  Acute respiratory failure with hypoxemia Aspiration Pneumonia- RLL infiltrate on CXR COVID infection  P: -LTVV strategy with tidal volumes of 4-8 cc/kg ideal body weight -Goal plateau pressures less than 30 and driving pressures of less than 15 -Wean PEEP/FiO2 for SpO2 92-98% -VAP bundle -Daily SAT and SBT -Not currently on sedation -Unasyn for aspiration pneumonia  Left MCA infarct, remote lacunar infarcts, small petechial hemorrhages - as seen on 7/29 MRI brain AMS  P: -MRA pending - Carotid 8/29 concerning for near distal L internal carotid artery occlusion. Vascular surgery has been consulted.  - AED per neurology - aspirin + statin -Continue neuroprotective measures -Continue aspiration precautions -Not currently on sedation.   Shock- ?sepsis vs cardiogenic vs mixed, +COVID, CHF hx Troponin elevation- [phone]. Suspect demand. No obvious ST changes seen on 12 lead. Lactate 1.4>0.8 -Continue levophed, Goal SBP 120-140 -Unable to anticoagulate due to L MCA infarct with petechial hemorrhage.  -  Lasix 40mg  IV once this AM  Hx of HFrEF -Echo 02/18/2021 with EF 25-30% and grade 2 diastolic dysfunction, he follow Dr. 04/20/2021. Repeat echo 7/29 showing same EF. Hx of HTN/HLD -Home medications include lipitor, lasix, imdur, toprol-xl, entresto, and aldactone  P: -continue tele -holding home GDMT and antihypertensives due to pressor  requirements -Strict I and O -Goal K above 4, goal MG above 2 -On ASA and statin  AKI superimposed on CKD stage IIIb -resolving P: -Ensure renal perfusion. Goal MAP 65 or greater. -Avoid neprotoxic drugs as possible. -Strict I&O's -will discontinue foley and place external catheter  Hx of diabetes  -Home medications include; glipizide and Jardiance  P: -Blood Glucose goal 140-180. -Start SSI  -holding home medications  Best Practice   Diet/type: tubefeeds DVT prophylaxis: SCD GI prophylaxis: PPI Lines: N/A Foley:  Yes, and it is still needed Code Status:  full code Last date of multidisciplinary goals of care discussion: Updated patient's son this morning. We discussed the long term outlook and he stated patient would not want a tracheostomy for prolonged recovery. He also stated he would not want to be in a long term rehab facility. Plan is to monitor recovery in regards to vent liberation and if not able to liberate from the vent in the near future, that transition to comfort care would be the next steps.  Critical care time: 35 minutes   8/29, MD [facility] Office: [phone]   See Amion for personal pager PCCM on call pager [phone] until 7pm. Please call Elink 7p-7a. [phone]

## 2021-07-12 NOTE — Progress Notes (Signed)
Pt transported on vent from 3M14 to MRI and back with out any complications. Vitals are stable, RN at beside, RT will continue to monitor.

## 2021-07-12 NOTE — Progress Notes (Signed)
PCCM Update: I spoke with patient's son [name] Corti in regards to the results of the carotid ultrasound and MRA head.  I relayed the prognosis from the neurology team that it is overall poor.  He expressed understanding and he will talk with his brother earlier today that they would like his father to have his last rights by the [employer] priest and will be talking with other family members about coming to visit prior to transitioning to comfort care.  I offered palliative care services but he did not feel it was necessary at this time.  He will plan to update the care team tomorrow with regards to the timing of the visit from the priest and transition of care.  Melody Comas, MD [facility] Office: [phone]   See Amion for personal pager PCCM on call pager [phone] until 7pm. Please call Elink 7p-7a. [phone]

## 2021-07-12 NOTE — Evaluation (Signed)
Physical Therapy Evaluation Patient Details Name: Tristan Hart MRN: [id number] DOB: 1936/11/17 Today's Date: 07/12/2021   History of Present Illness  Pt adm 7/28 with AMS. Pt with +covid test at home on 7/20. Pt with worsening confusion and then lethargy in ED and developed acute hypoxic respiratory failure and was intubated. MRI 7/28 showed large L MCA infarct and a intraluminal thrombus in the distal Lt MCA. Pt with fall prior to ED and in ED x 2.  PMH - chf, HTN, ckd  Clinical Impression  Evaluated pt at bed level. He remains intubated and I was unable to arouse him with verbal/tactile stimuli and changes in bed position. He does appear to have muscular tone in LUE and BLE but RUE appears flaccid. Will need to assess further when more awake. Expect if pt progresses medically will need SNF.     Follow Up Recommendations SNF    Equipment Recommendations  Wheelchair (measurements PT);Wheelchair cushion (measurements PT);Other (comment) (hoyer)    Recommendations for Other Services       Precautions / Restrictions Precautions Precautions: Fall;Other (comment) (vent) Restrictions Weight Bearing Restrictions: No      Mobility  Bed Mobility               General bed mobility comments: Placed bed in more upright position without any change in pt's responsiveness.    Transfers                    Ambulation/Gait                Stairs            Wheelchair Mobility    Modified Rankin (Stroke Patients Only)       Balance                                             Pertinent Vitals/Pain Pain Assessment: Faces Faces Pain Scale: No hurt    Home Living Family/patient expects to be discharged to:: Unsure                 Additional Comments: Pt unable to communicate and family not present.    Prior Function           Comments: Pt unable to state. Likely independent with mobility.     Hand Dominance         Extremity/Trunk Assessment   Upper Extremity Assessment Upper Extremity Assessment: Defer to OT evaluation    Lower Extremity Assessment Lower Extremity Assessment: RLE deficits/detail;LLE deficits/detail;Difficult to assess due to impaired cognition RLE Deficits / Details: PROM WFL. Pt did appear to have some activation of extension with ROM. LLE Deficits / Details: PROM WFL. Pt appears to have some muscular tone present       Communication   Communication: Other (comment) (orally intubated)  Cognition Arousal/Alertness: Lethargic   Overall Cognitive Status: Difficult to assess                                 General Comments: Attempted to arouse pt with verbal/tactile stimuli and unable      General Comments      Exercises     Assessment/Plan    PT Assessment Patient needs continued PT services  PT Problem List Decreased strength;Decreased activity tolerance;Decreased balance;Decreased mobility;Decreased cognition  PT Treatment Interventions Functional mobility training;DME instruction;Therapeutic activities;Therapeutic exercise;Balance training;Neuromuscular re-education;Cognitive remediation;Patient/family education    PT Goals (Current goals can be found in the Care Plan section)  Acute Rehab PT Goals PT Goal Formulation: Patient unable to participate in goal setting Time For Goal Achievement: 07/26/21 Potential to Achieve Goals: Fair Additional Goals Additional Goal #1: Pt will maintain arousal for 50% of session    Frequency Min 2X/week   Barriers to discharge        Co-evaluation               AM-PAC PT "6 Clicks" Mobility  Outcome Measure Help needed turning from your back to your side while in a flat bed without using bedrails?: Total Help needed moving from lying on your back to sitting on the side of a flat bed without using bedrails?: Total Help needed moving to and from a bed to a chair (including a wheelchair)?:  Total Help needed standing up from a chair using your arms (e.g., wheelchair or bedside chair)?: Total Help needed to walk in hospital room?: Total Help needed climbing 3-5 steps with a railing? : Total 6 Click Score: 6    End of Session   Activity Tolerance: Patient limited by lethargy Patient left: in bed;with restraints reapplied   PT Visit Diagnosis: Other abnormalities of gait and mobility (R26.89);Hemiplegia and hemiparesis Hemiplegia - Right/Left: Right Hemiplegia - caused by: Cerebral infarction    Time: 3009-2330 PT Time Calculation (min) (ACUTE ONLY): 16 min   Charges:   PT Evaluation $PT Eval Moderate Complexity: 1 Mod          [facility] PT Acute Rehabilitation Services Pager [phone] Office [phone]   Angelina Ok [facility] 07/12/2021, 9:31 AM

## 2021-07-12 NOTE — Consult Note (Signed)
ASSESSMENT & PLAN   LEFT BRAIN STROKE: This patient has had a large left brain stroke and based on his duplex and MRA he has a left carotid occlusion.  There is nothing to do from a vascular standpoint.  Based on my review of the studies I think the left carotid is occluded.  Even if he had trickle flow through this, given the size of the stroke he would not be a candidate for intervention.  Vascular surgery will be available as needed.  REASON FOR CONSULT:    Left carotid occlusion.  The consult is requested by critical care medicine.  HPI:   Tristan Hart is a 85 y.o. male who was admitted on 07/09/2021 with altered mental status.  He has multiple medical comorbidities including hypertension, congestive heart failure, stage III chronic kidney disease.  In addition he tested positive for COVID on 07/01/2021 using a home test.  He presented with a 3-day history of altered mental status and poor balance.  His initial head CT was negative however he subsequently became agitated and somnolent and acutely unresponsive and was admitted by the critical care service.  His MRI on 729 demonstrated a left MCA infarct and small lacunar infarcts.  Carotid Doppler study today shows likely occlusion of the left carotid artery.  I am unable to obtain any meaningful history from the patient who is on a ventilator and poorly responsive.  Past Medical History:  Diagnosis Date   Congestive heart failure (CHF) ([place])    Hyperlipidemia    Hypertension     Family History  Problem Relation Age of Onset   Diabetes Mother    Hypertension Mother    Alzheimer's disease Sister    Diabetes Brother    Hypertension Brother    Stroke Brother    Cancer Brother     SOCIAL HISTORY: Social History   Tobacco Use   Smoking status: Never   Smokeless tobacco: Never  Substance Use Topics   Alcohol use: Not Currently    Not on File  Current Facility-Administered Medications  Medication Dose Route Frequency  Provider Last Rate Last Admin   0.9 %  sodium chloride infusion  250 mL Intravenous Continuous Anders Simmonds, MD       0.9 %  sodium chloride infusion  250 mL Intravenous Continuous Estill Cotta, NP 10 mL/hr at 07/12/21 0000 Infusion Verify at 07/12/21 0000   acetaminophen (TYLENOL) 160 MG/5ML solution 650 mg  650 mg Per Tube Q4H PRN Eliezer Bottom T, MD   650 mg at 07/10/21 2105   Ampicillin-Sulbactam (UNASYN) 3 g in sodium chloride 0.9 % 100 mL IVPB  3 g Intravenous Q8H Donald Pore, RPH 200 mL/hr at 07/12/21 0911 3 g at 07/12/21 0911   aspirin chewable tablet 81 mg  81 mg Per Tube Daily Anders Simmonds, MD   81 mg at 07/12/21 0913   chlorhexidine gluconate (MEDLINE KIT) (PERIDEX) 0.12 % solution 15 mL  15 mL Mouth Rinse BID Audria Nine, DO   15 mL at 07/12/21 0914   Chlorhexidine Gluconate Cloth 2 % PADS 6 each  6 each Topical Daily Audria Nine, DO   6 each at 07/11/21 0855   docusate (COLACE) 50 MG/5ML liquid 100 mg  100 mg Per Tube BID Cristal Generous, NP   100 mg at 07/12/21 0913   feeding supplement (VITAL AF 1.2 CAL) liquid 1,000 mL  1,000 mL Per Tube Continuous Freddi Starr, MD 60 mL/hr at 07/11/21  2109 1,000 mL at 07/11/21 2109   insulin aspart (novoLOG) injection 0-15 Units  0-15 Units Subcutaneous Q4H Estill Cotta, NP   3 Units at 07/12/21 1158   MEDLINE mouth rinse  15 mL Mouth Rinse 10 times per day Audria Nine, DO   15 mL at 07/12/21 1159   norepinephrine (LEVOPHED) 4mg  in 211mL premix infusion  2-10 mcg/min Intravenous Titrated Freddi Starr, MD 15 mL/hr at 07/12/21 0554 4 mcg/min at 07/12/21 0554   pantoprazole (PROTONIX) injection 40 mg  40 mg Intravenous QHS Cristal Generous, NP   40 mg at 07/11/21 2107   polyethylene glycol (MIRALAX / GLYCOLAX) packet 17 g  17 g Per Tube Daily Bowser, Laurel Dimmer, NP   17 g at 07/12/21 0913   potassium PHOSPHATE 15 mmol in dextrose 5 % 250 mL infusion  15 mmol Intravenous Once Margaretmary Lombard, MD 43 mL/hr  at 07/12/21 0805 15 mmol at 07/12/21 0805    REVIEW OF SYSTEMS:  -  PHYSICAL EXAM:   Vitals:   07/12/21 0700 07/12/21 0708 07/12/21 0800 07/12/21 0828  BP: 124/78  121/76   Pulse: (!) 51  (!) 55 (!) 46  Resp: 20  20 20   Temp:  98.2 F (36.8 C)    TempSrc:      SpO2: 98%  97% 100%  Weight:      Height:       Body mass index is 29.2 kg/m. GENERAL: The patient is a well-nourished male, on a ventilator.  The vital signs are documented above. CARDIAC: There is a regular rate and rhythm.  VASCULAR: I do not detect carotid bruits. He has palpable femoral pulses bilaterally.  I cannot palpate pedal pulses. PULMONARY: There is good air exchange bilaterally without wheezing or rales. ABDOMEN: Soft and non-tender with normal pitched bowel sounds.  MUSCULOSKELETAL: There are no major deformities. NEUROLOGIC: Unable to assess as he is on a ventilator and does not follow commands.  He was moving his left arm some.  His right arm is flaccid. SKIN: There are no ulcers or rashes noted.  DATA:    CAROTID DUPLEX: I have independently interpreted his carotid duplex scan.  On the right side there is evidence of a 40 to 59% carotid stenosis.  The right vertebral artery is patent with antegrade flow.  On the left side there is no diastolic flow.  This would suggest a distal occlusion. The left vertebral artery is patent with antegrade flow.   MRI BRAIN: I reviewed his MRI of the brain was done on 07/09/2021.  This showed a large acute ischemic left middle cerebral artery distribution infarct.  There was some associated petechial hemorrhage.  There was abnormal flow within the left ICA to the level of the terminus suggesting that this was occluded.  Deitra Mayo [facility]

## 2021-07-12 NOTE — Progress Notes (Signed)
Carotid artery duplex completed. Refer to "CV Proc" under chart review to view preliminary results.  07/12/2021 10:23 AM Eula Fried., MHA, RVT, RDCS, RDMS

## 2021-07-13 DIAGNOSIS — I6522 Occlusion and stenosis of left carotid artery: Secondary | ICD-10-CM

## 2021-07-13 DIAGNOSIS — U071 COVID-19: Secondary | ICD-10-CM | POA: Diagnosis not present

## 2021-07-13 DIAGNOSIS — Z9911 Dependence on respirator [ventilator] status: Secondary | ICD-10-CM | POA: Diagnosis not present

## 2021-07-13 DIAGNOSIS — J9600 Acute respiratory failure, unspecified whether with hypoxia or hypercapnia: Secondary | ICD-10-CM

## 2021-07-13 LAB — COMPREHENSIVE METABOLIC PANEL
ALT: 24 U/L (ref 0–44)
AST: 26 U/L (ref 15–41)
Albumin: 2.2 g/dL — ABNORMAL LOW (ref 3.5–5.0)
Alkaline Phosphatase: 68 U/L (ref 38–126)
Anion gap: 10 (ref 5–15)
BUN: 40 mg/dL — ABNORMAL HIGH (ref 8–23)
CO2: 23 mmol/L (ref 22–32)
Calcium: 9 mg/dL (ref 8.9–10.3)
Chloride: 106 mmol/L (ref 98–111)
Creatinine, Ser: 1.71 mg/dL — ABNORMAL HIGH (ref 0.61–1.24)
GFR, Estimated: 39 mL/min — ABNORMAL LOW (ref >60)
Glucose, Bld: 181 mg/dL — ABNORMAL HIGH (ref 70–99)
Potassium: 3.5 mmol/L (ref 3.5–5.1)
Sodium: 139 mmol/L (ref 135–145)
Total Bilirubin: 1.7 mg/dL — ABNORMAL HIGH (ref 0.3–1.2)
Total Protein: 5.8 g/dL — ABNORMAL LOW (ref 6.5–8.1)

## 2021-07-13 LAB — CBC
HCT: 36.1 % — ABNORMAL LOW (ref 39.0–52.0)
Hemoglobin: 12 g/dL — ABNORMAL LOW (ref 13.0–17.0)
MCH: 30.8 pg (ref 26.0–34.0)
MCHC: 33.2 g/dL (ref 30.0–36.0)
MCV: 92.8 fL (ref 80.0–100.0)
Platelets: 286 10*3/uL (ref 150–400)
RBC: 3.89 MIL/uL — ABNORMAL LOW (ref 4.22–5.81)
RDW: 13.4 % (ref 11.5–15.5)
WBC: 10.2 10*3/uL (ref 4.0–10.5)
nRBC: 0 % (ref 0.0–0.2)

## 2021-07-13 LAB — GLUCOSE, CAPILLARY
Glucose-Capillary: 100 mg/dL — ABNORMAL HIGH (ref 70–99)
Glucose-Capillary: 139 mg/dL — ABNORMAL HIGH (ref 70–99)
Glucose-Capillary: 144 mg/dL — ABNORMAL HIGH (ref 70–99)
Glucose-Capillary: 161 mg/dL — ABNORMAL HIGH (ref 70–99)
Glucose-Capillary: 172 mg/dL — ABNORMAL HIGH (ref 70–99)
Glucose-Capillary: 174 mg/dL — ABNORMAL HIGH (ref 70–99)
Glucose-Capillary: 204 mg/dL — ABNORMAL HIGH (ref 70–99)

## 2021-07-13 MED ORDER — ATORVASTATIN CALCIUM 40 MG PO TABS
40.0000 mg | ORAL_TABLET | Freq: Every day | ORAL | Status: DC
  Administered 2021-07-13 – 2021-07-14 (×2): 40 mg
  Filled 2021-07-13 (×2): qty 1

## 2021-07-13 MED ORDER — PANTOPRAZOLE SODIUM 40 MG PO PACK
40.0000 mg | PACK | Freq: Every day | ORAL | Status: DC
  Administered 2021-07-13: 40 mg
  Filled 2021-07-13: qty 20

## 2021-07-13 NOTE — Progress Notes (Signed)
NAME:  Tristan Hart, MRN:  [id number], DOB:  1936-08-16, LOS: 4 ADMISSION DATE:  07/07/2021, CONSULTATION DATE:  7/28 REFERRING MD:  FPTS, CHIEF COMPLAINT:  respiratory failure    History of Present Illness:  85yo male with hx HTN, CHF, CKDIII who tested POS for COVID 7/20 (home test, unknown vaccination status) presented 7/28 with 3 days AMS, poor balance. He was seen in ER where initial w/u was essentially NEG with normal head CT, reassuring neuro exam and was being admitted by FPTS.  However while in ER he became agitated, then somnolent then had acute unresponsiveness and PCCM called urgently for intubation and ICU admission.   Pertinent  Medical History   has a past medical history of Congestive heart failure (CHF) (HCC), Hyperlipidemia, and Hypertension.   Significant Hospital Events: Including procedures, antibiotic start and stop dates in addition to other pertinent events   CT head 7/28>>neg acute  7/29 MRI demonstrated L MCA infarct and small lacunar infarcts 7/31 Carotid Doppler noted near occlusion of left carotid artery   Interim History / Subjective:  Minimal responsiveness to voice, not answering questions.   Objective   Blood pressure 116/62, pulse 60, temperature 99.5 F (37.5 C), temperature source Axillary, resp. rate 18, height 5\' 8"  (1.727 m), weight 87.1 kg, SpO2 100 %.    Vent Mode: PRVC FiO2 (%):  [40 %] 40 % Set Rate:  [20 bmp] 20 bmp Vt Set:  [540 mL] 540 mL PEEP:  [5 cmH20] 5 cmH20 Plateau Pressure:  [14 cmH20-18 cmH20] 18 cmH20   Intake/Output Summary (Last 24 hours) at 07/13/2021 1230 Last data filed at 07/13/2021 1000 Gross per 24 hour  Intake 243.04 ml  Output 1100 ml  Net -856.96 ml    Filed Weights   07/09/21 0930 07/09/21 2015 07/12/21 0322  Weight: 84.3 kg 85.6 kg 87.1 kg   Examination: General:  ill appearing man lying in bed in NAD HEENT: [place]/AT, eyes anicteric, ETT in place. Neuro: with stimulation slightly opening eyes, not moving  head to answer questions or lift it off the pillow. Withdraws left foot slightly to nailbed pressure, moved LUE to right sided pain. Not moving R side. Repositioning pillow with LUE. Can squeeze eyes shut and lift LUE off the bed on command. Examined off sedation. CV: S1S2, RRR PULM:  breathing synchronously with MV, on SBT bradypneic, but breathing faster with stimulation. GI: soft, NT, ND Extremities:  mild LE edema, no cyanosis Skin: warm, dry, no bruising, no rashes.  MRA brain: large vessel occlusion L ICA, associated L MCA and ACA occlusion. Occluded distal L vertebral artery- chronic. Moderate R ICA stenosis, mild mid-basilar stenosis, mild to mod L PCA stenosis.  Resolved Hospital Problem list     Assessment & Plan:  Acute respiratory failure with hypoxemia requiring MV Aspiration pneumonia- RLL infiltrate on CXR COVID infection  P: -LTVV, 4-8cc/kg IBW, goal Pplat<30 and DP<15. -daily SAT & SBT; not adequately ventilating without stimulation. -wean FiO2 as able to maintain SpO2 >90% -VAP prevention bundle -PAD protocol; not currently requiring sedation -Unasyn for aspiration pneumonia; complete 7 days  Left MCA infarct, remote lacunar infarcts, small petechial hemorrhages - as seen on 7/29 MRI brain. L ICA total occlusion, L MCA and ACA occlusions, chronic left vertebral occlusion.  Acute encephalopathy due to stroke  P: -Not a candidate for surgical intervention due to degree of ICA occlusion. Prognosis for recovery is poor. - neurology input appreciated; s/o on 7/31 - aspirin + statin daily - Neuroprotective  measures - ongoing intubation for aspiration precautions and ensure adequate ventilation - Con't to hold sedation unless he develops agitation or changing to full comfort care  Shock- ?sepsis vs cardiogenic vs mixed, +COVID, CHF hx. Troponin elevation- [phone]. Suspect demand ischemia vs due to stroke. No obvious ST changes seen on 12 lead. Lactate 1.4>0.8 -NE to  maintain SBP >130 for cerebral perfusion -Unable to anticoagulate due to L MCA infarct with petechial hemorrhage.   Chronic HFrEF -Echo 02/18/2021 with EF 25-30% and grade 2 diastolic dysfunction, he follows with Dr. Clarise Cruz. Repeat echo 7/29 showing same EF. Hx of HTN/HLD -Home medications include lipitor, lasix, imdur, toprol-xl, entresto, and aldactone  P: -continue tele monitoring -holding baseline GDMT due to shock and pressor requirements -strict I/Os -optimize electrolytes -con't ASA and statin  AKI superimposed on CKD stage IIIb -resolving P: -strict I/Os -renally dose meds, avoid nephrotoxic meds -con't external catheter  Hx of diabetes  -Home medications include; glipizide and Jardiance  P: -goal BG 140-180. -SSI PRN -holding home jardiance  Hyperbilirubinemia- possibly congestive hepatopathy vs hemolysis vs medication effect. No associated LFT abnormalities.  -monitor LFTs -monitor CBCs; may need hemolysis labs if ongoing Hb drop.  Acute anemia, likely due to critical illness and frequent phlebotomy. Less likely hemolytic anemia -transfuse for Hb<7 or hemodynamically significant bleeding  Will update family at bedside today. Family had requested last rights- looking into it.  Best Practice   Diet/type: tubefeeds DVT prophylaxis: SCD GI prophylaxis: PPI Lines: N/A Foley:  removal ordered  Code Status:  full code Last date of multidisciplinary goals of care discussion:  7/31 - Updated patient's son this morning. We discussed the long term outlook and he stated patient would not want a tracheostomy for prolonged recovery. He also stated he would not want to be in a long term rehab facility. Plan is to monitor recovery in regards to vent liberation and if not able to liberate from the vent in the near future, that transition to comfort care would be the next steps.   This patient is critically ill with multiple organ system failure which requires frequent high  complexity decision making, assessment, support, evaluation, and titration of therapies. This was completed through the application of advanced monitoring technologies and extensive interpretation of multiple databases. During this encounter critical care time was devoted to patient care services described in this note for 45 minutes.  Steffanie Dunn, DO 07/13/21 1:16 PM [facility]

## 2021-07-13 NOTE — Progress Notes (Signed)
OT Cancellation Note  Patient Details Name: Tristan Hart MRN: [id number] DOB: 1935-12-20   Cancelled Treatment:    Reason Eval/Treat Not Completed: Medical issues which prohibited therapy;Patient's level of consciousness. Per recent MD note, patient with overall poor prognosis with plan to transition to comfort care. OT to sign off at this time.   [name] OTR/L Supplemental OT, Department of rehab services [phone]  Clarke Amburn R H. 07/13/2021, 7:01 AM

## 2021-07-13 DEATH — deceased

## 2021-07-14 DIAGNOSIS — U071 COVID-19: Secondary | ICD-10-CM | POA: Diagnosis not present

## 2021-07-14 DIAGNOSIS — J9601 Acute respiratory failure with hypoxia: Secondary | ICD-10-CM | POA: Diagnosis not present

## 2021-07-14 DIAGNOSIS — J690 Pneumonitis due to inhalation of food and vomit: Secondary | ICD-10-CM

## 2021-07-14 LAB — COMPREHENSIVE METABOLIC PANEL
ALT: 19 U/L (ref 0–44)
AST: 21 U/L (ref 15–41)
Albumin: 2.1 g/dL — ABNORMAL LOW (ref 3.5–5.0)
Alkaline Phosphatase: 56 U/L (ref 38–126)
Anion gap: 14 (ref 5–15)
BUN: 48 mg/dL — ABNORMAL HIGH (ref 8–23)
CO2: 23 mmol/L (ref 22–32)
Calcium: 9.1 mg/dL (ref 8.9–10.3)
Chloride: 101 mmol/L (ref 98–111)
Creatinine, Ser: 1.95 mg/dL — ABNORMAL HIGH (ref 0.61–1.24)
GFR, Estimated: 34 mL/min — ABNORMAL LOW (ref >60)
Glucose, Bld: 224 mg/dL — ABNORMAL HIGH (ref 70–99)
Potassium: 3.6 mmol/L (ref 3.5–5.1)
Sodium: 138 mmol/L (ref 135–145)
Total Bilirubin: 1.4 mg/dL — ABNORMAL HIGH (ref 0.3–1.2)
Total Protein: 5.7 g/dL — ABNORMAL LOW (ref 6.5–8.1)

## 2021-07-14 LAB — CBC
HCT: 37 % — ABNORMAL LOW (ref 39.0–52.0)
Hemoglobin: 12.3 g/dL — ABNORMAL LOW (ref 13.0–17.0)
MCH: 30.9 pg (ref 26.0–34.0)
MCHC: 33.2 g/dL (ref 30.0–36.0)
MCV: 93 fL (ref 80.0–100.0)
Platelets: 255 10*3/uL (ref 150–400)
RBC: 3.98 MIL/uL — ABNORMAL LOW (ref 4.22–5.81)
RDW: 13.4 % (ref 11.5–15.5)
WBC: 8.1 10*3/uL (ref 4.0–10.5)
nRBC: 0 % (ref 0.0–0.2)

## 2021-07-14 LAB — GLUCOSE, CAPILLARY
Glucose-Capillary: 189 mg/dL — ABNORMAL HIGH (ref 70–99)
Glucose-Capillary: 194 mg/dL — ABNORMAL HIGH (ref 70–99)
Glucose-Capillary: 164 mg/dL — ABNORMAL HIGH (ref 70–99)
Glucose-Capillary: 178 mg/dL — ABNORMAL HIGH (ref 70–99)

## 2021-07-14 MED ORDER — ACETAMINOPHEN 650 MG RE SUPP: 650.0000 mg | Freq: Four times a day (QID) | RECTAL | Status: DC | PRN

## 2021-07-14 MED ORDER — GLYCOPYRROLATE 1 MG PO TABS: 1.0000 mg | ORAL_TABLET | ORAL | Status: DC | PRN

## 2021-07-14 MED ORDER — ALBUTEROL SULFATE (2.5 MG/3ML) 0.083% IN NEBU: 2.5000 mg | INHALATION_SOLUTION | RESPIRATORY_TRACT | Status: DC | PRN

## 2021-07-14 MED ORDER — ALBUTEROL SULFATE HFA 108 (90 BASE) MCG/ACT IN AERS
1.0000 | INHALATION_SPRAY | RESPIRATORY_TRACT | Status: DC | PRN
  Filled 2021-07-14: qty 6.7

## 2021-07-14 MED ORDER — ONDANSETRON HCL 4 MG/2ML IJ SOLN: 4.0000 mg | Freq: Four times a day (QID) | INTRAMUSCULAR | Status: DC | PRN

## 2021-07-14 MED ORDER — DIPHENHYDRAMINE HCL 50 MG/ML IJ SOLN: 25.0000 mg | INTRAMUSCULAR | Status: DC | PRN

## 2021-07-14 MED ORDER — ONDANSETRON 4 MG PO TBDP: 4.0000 mg | ORAL_TABLET | Freq: Four times a day (QID) | ORAL | Status: DC | PRN

## 2021-07-14 MED ORDER — LORAZEPAM 2 MG/ML IJ SOLN: 2.0000 mg | INTRAMUSCULAR | Status: DC | PRN

## 2021-07-14 MED ORDER — MORPHINE BOLUS VIA INFUSION
5.0000 mg | INTRAVENOUS | Status: DC | PRN
  Filled 2021-07-14: qty 5

## 2021-07-14 MED ORDER — HALOPERIDOL LACTATE 5 MG/ML IJ SOLN: 2.5000 mg | INTRAMUSCULAR | Status: DC | PRN

## 2021-07-14 MED ORDER — POLYVINYL ALCOHOL 1.4 % OP SOLN
1.0000 [drp] | Freq: Four times a day (QID) | OPHTHALMIC | Status: DC | PRN
  Filled 2021-07-14: qty 15

## 2021-07-14 MED ORDER — DEXTROSE 5 % IV SOLN: INTRAVENOUS | Status: DC

## 2021-07-14 MED ORDER — ACETAMINOPHEN 325 MG PO TABS: 650.0000 mg | ORAL_TABLET | Freq: Four times a day (QID) | ORAL | Status: DC | PRN

## 2021-07-14 MED ORDER — MORPHINE SULFATE (PF) 2 MG/ML IV SOLN: 2.0000 mg | INTRAVENOUS | Status: DC | PRN

## 2021-07-14 MED ORDER — GLYCOPYRROLATE 0.2 MG/ML IJ SOLN: 0.2000 mg | INTRAMUSCULAR | Status: DC | PRN

## 2021-07-14 MED ORDER — MORPHINE 100MG IN NS 100ML (1MG/ML) PREMIX INFUSION: 0.0000 mg/h | INTRAVENOUS | Status: DC

## 2021-08-04 ENCOUNTER — Encounter (HOSPITAL_COMMUNITY): Payer: Medicare HMO | Admitting: Internal Medicine

## 2021-08-13 NOTE — Progress Notes (Signed)
NAME:  Tristan Hart, MRN:  [id number], DOB:  [birth year], LOS: 5 ADMISSION DATE:  06/16/2021, CONSULTATION DATE:  7/28 REFERRING MD:  FPTS, CHIEF COMPLAINT:  respiratory failure    History of Present Illness:  85yo male with hx HTN, CHF, CKDIII who tested POS for COVID 7/20 (home test, unknown vaccination status) presented 7/28 with 3 days AMS, poor balance. He was seen in ER where initial w/u was essentially NEG with normal head CT, reassuring neuro exam and was being admitted by FPTS.  However while in ER he became agitated, then somnolent then had acute unresponsiveness and PCCM called urgently for intubation and ICU admission.   Pertinent  Medical History   has a past medical history of Congestive heart failure (CHF) (HCC), Hyperlipidemia, and Hypertension.   Significant Hospital Events: Including procedures, antibiotic start and stop dates in addition to other pertinent events   CT head 7/28>>neg acute  7/29 MRI demonstrated L MCA infarct and small lacunar infarcts 7/31 Carotid Doppler noted near occlusion of left carotid artery   Interim History / Subjective:  No events Remains fairly comatose on vent without sedation  Objective   Blood pressure (!) 123/50, pulse 78, temperature 98.9 F (37.2 C), temperature source Axillary, resp. rate (!) 22, height 5\' 8"  (1.727 m), weight 87.1 kg, SpO2 100 %.    Vent Mode: PSV;CPAP FiO2 (%):  [40 %] 40 % Set Rate:  [16 bmp] 16 bmp Vt Set:  [540 mL] 540 mL PEEP:  [5 cmH20] 5 cmH20 Pressure Support:  [10 cmH20] 10 cmH20 Plateau Pressure:  [14 cmH20-17 cmH20] 15 cmH20   Intake/Output Summary (Last 24 hours) at 08-02-21 0932 Last data filed at 2021-08-02 0600 Gross per 24 hour  Intake --  Output 700 ml  Net -700 ml    Filed Weights   07/09/21 0930 07/09/21 2015 07/12/21 0322  Weight: 84.3 kg 85.6 kg 87.1 kg   Examination: Constitutional: chronically ill man in NAD  Eyes: L>R pupil, reactive Ears, nose, mouth, and throat: ETT in  place, minimal secretions Cardiovascular: RRR, ext warm Respiratory: clear, triggering vent Gastrointestinal: soft, hypoactive BS Skin: No rashes, normal turgor Neurologic: withdraws LLE to pain, some purposeful movement on LUE, 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 L ICA total occlusion, L MCA and ACA occlusions, chronic left vertebral occlusion.  Acute encephalopathy due to stroke  -Not candidate for intervention -No good long term prognosis here -Family does not think he would want trach/PEG/LTACH  Shock- ?sepsis vs cardiogenic vs mixed, +COVID, CHF hx. Troponin elevation- [phone]. Suspect demand ischemia vs due to stroke. No obvious ST changes seen on 12 lead. Lactate 1.4>0.8 -NE to maintain SBP >130 for cerebral perfusion -Unable to anticoagulate due to L MCA infarct with petechial hemorrhage.   Chronic HFrEF -Echo 02/18/2021 with EF 25-30% and grade 2 diastolic dysfunction, he follows with Dr. 04/20/2021. Repeat echo 7/29 showing same EF. Hx of HTN/HLD -Home medications include lipitor, lasix, imdur, toprol-xl, entresto, and aldactone  -continue tele monitoring -holding baseline GDMT due to shock and pressor requirements -  strict I/Os -optimize electrolytes -con't ASA and statin  AKI superimposed on CKD stage IIIb Stable, avoid nephrotoxins  Hx of diabetes  Up a bit, just monitor with SSI for now  Hyperbilirubinemia- minimal, NTD  Acute anemia, stable/resolved  Called son Dr. Karsten Fells.  Plan today will be to continue vent support  until last rights at 4:30PM.  Once last rights performed, extubate to comfort.  Suspect he may last a while due to his intact respiratory drive.  Patient critically ill due to respiratory failure Interventions to address this today vent titration Risk of deterioration without these interventions is high  I personally spent 32 minutes providing critical care not including any separately billable procedures  Myrla Halsted MD [facility]  Prefer epic messenger for cross cover needs If after hours, please call E-link

## 2021-08-13 NOTE — Death Summary Note (Signed)
DEATH SUMMARY   Patient Details  Name: Tristan Hart MRN: [id number] DOB: 08-13-1936  Admission/Discharge Information   Admit Date:  08/01/2021  Date of Death: Date of Death: 08-07-2021  Time of Death: Time of Death: May 05, 2109  Length of Stay: 6  Referring Physician: Littie Deeds, MD   Reason(s) for Hospitalization  Stroke  Diagnoses  Preliminary cause of death:  Secondary Diagnoses (including complications and co-morbidities):  Active Problems:   Altered mental status   Acute respiratory failure due to COVID-19 (HCC)   Pressure injury of skin  Acute respiratory failure with hypoxia requiring mechanical ventilation Aspiration pneumonia Covid infection Acute L MCA infarction, 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 year old male who presented with acute encephalopathy and had deteriorating mental status requiring intubation for airway protection. He was found to have an acute stroke of the left MCA and ACA. Further workup demonstrated significant peripheral artery disease with complete occlusion of the L ICA and basilar artery. He was not a candidate for revascularization due to degree of stenosis. Given his severe deficits and poor prognosis, family elected to withdraw aggressive care measures on 2023-08-09 and he passed away with family. Stroke managed with antiplatelets and statin. Neurology assisted with his care.  Acute respiratory failure with hypoxia, covid infection, likely aspiration pneumonia- supported with MV, with antibiotics, antivirals, and steroids.   Shock, Chronic HFrEF were managed  with vasopressors to drive BP to higher level to ensure adequate cerebral perfusion. GDMT for heart failure and antihypertensives held while on pressors.  DM managed with insulin.  AKI managed with volume status, maintenance of renal perfusion.      Pertinent Labs and Studies  Significant Diagnostic Studies DG Chest 2 View  Result Date: 01-Aug-2021 CLINICAL DATA:  COVID positive.  Confusion. EXAM: CHEST - 2 VIEW COMPARISON:  None. FINDINGS: Low lung volumes. Heart size grossly normal. Question of retrocardiac hiatal hernia, not well assessed on the current exam. Coronary artery calcification or stent is seen. There are streaky bibasilar opacities. No pulmonary edema. No pleural effusion. No pneumothorax. Anti lordotic positioning without acute osseous abnormalities. IMPRESSION: Low lung volumes with streaky bibasilar opacities, likely atelectasis, however pneumonia cannot be excluded in the setting of COVID-19. Electronically Signed   By: Narda Rutherford M.D.   On: 2021-08-01 22:47   CT HEAD WO CONTRAST  Result Date: 07/09/2021 CLINICAL DATA:  Mental status change, unknown cause. No reported injury. EXAM: CT HEAD WITHOUT CONTRAST TECHNIQUE: Contiguous axial images were obtained from the base of the skull through the vertex without intravenous contrast. COMPARISON:  Head CT from earlier today. FINDINGS: Brain: No evidence of parenchymal hemorrhage or extra-axial fluid collection. No mass lesion, mass effect, or midline shift. No CT evidence of acute infarction. Generalized cerebral volume loss. Nonspecific mild subcortical and periventricular white matter hypodensity, most in keeping with chronic small vessel ischemic change. No ventriculomegaly. Vascular: No acute abnormality. Skull: No evidence of calvarial fracture. Sinuses/Orbits: No fluid levels. Mucoperiosteal thickening throughout the bilateral ethmoidal air cells. Other:  The mastoid air cells are unopacified. IMPRESSION: 1. No evidence of  acute intracranial abnormality. 2. Generalized cerebral volume loss and mild chronic  small vessel ischemic changes in the cerebral white matter. 3. Mild chronic appearing paranasal sinusitis. Electronically Signed   By: Delbert Phenix M.D.   On: 07/09/2021 16:31   CT Head Wo Contrast  Result Date: 07/09/2021 CLINICAL DATA:  Altered mental status. EXAM: CT HEAD WITHOUT CONTRAST TECHNIQUE: Contiguous axial images were obtained from the base of the skull through the vertex without intravenous contrast. COMPARISON:  None. FINDINGS: Brain: There is moderate severity cerebral atrophy with widening of the extra-axial spaces and ventricular dilatation. There are areas of decreased attenuation within the white matter tracts of the supratentorial brain, consistent with microvascular disease changes. Vascular: No hyperdense vessel or unexpected calcification. Skull: Normal. Negative for fracture or focal lesion. Sinuses/Orbits: No acute finding. Other: None. IMPRESSION: 1. Generalized cerebral atrophy. 2. No acute intracranial abnormality. Electronically Signed   By: Aram Candela M.D.   On: 07/09/2021 01:21   MR ANGIO HEAD WO CONTRAST  Addendum Date: 07/12/2021   ADDENDUM REPORT: 07/12/2021 11:43 ADDENDUM: Study discussed by telephone with Stroke Team NP Metzger-Cihelka on 07/12/2021 at 1120 hours. Electronically Signed   By: Odessa Fleming M.D.   On: 07/12/2021 11:43   Result Date: 07/12/2021 CLINICAL DATA:  85 year old male with large left MCA infarct and absent left ICA flow void on brain MRI 07/09/2021. Positive for COVID-19 on 07/01/2021, intubated. EXAM: MRA HEAD WITHOUT CONTRAST TECHNIQUE: Angiographic images of the Circle of Willis were acquired using MRA technique without intravenous contrast. COMPARISON:  Brain MRI 07/09/2021. FINDINGS: Mild mass effect now on the left lateral ventricle. But no intracranial midline shift (series 2, image 134). Loss of sulci in the left MCA territory. Antegrade flow in the  basilar artery, but there appears to be loss of flow signal in the distal left vertebral artery, which also had a partially abnormal flow void on the recent MRI. The distal right vertebral artery appears patent along with the right vertebrobasilar junction. Mid basilar artery irregularity is moderate, but with only mild mid basilar stenosis. Distal basilar is patent. SCA and PCA origins are patent. Posterior communicating arteries are diminutive or absent. Right PCA branches are within normal limits. There is mild to moderate irregularity and stenosis of the left PCA P1 and P2. Preserved distal left PCA flow signal. Absent flow in the left ICA siphon, left ICA terminus, left MCA, and left ACA origin. The distal left A1 and the left A2 appear supplied via the anterior communicating artery. There is fairly symmetric bilateral ACA branch flow signal. Right ICA siphon is patent with antegrade flow to the right ICA terminus. There is moderate stenosis of the right ICA supraclinoid segment on series 255, image 15. Patent right MCA and ACA origins. Normal right ACA A1. Right MCA M1 segment and bifurcation are patent. Visible right MCA branches are within normal limits. IMPRESSION: 1. 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 This is at T-occlusion, with associated Left MCA and ACA origins occluded. No reconstitution of the MCA. The Left ACA is reconstituted via the A-comm. 2. Occluded distal Left Vertebral Artery also, also evident on the recent MRI and likely chronic given the absence of posterior circulation ischemia on that exam. 3. Evidence of underlying intracranial atherosclerosis, with up to moderate stenosis of the distal Right ICA siphon, mild mid Basilar and mild to moderate Left PCA stenoses. Electronically Signed: By: Odessa Fleming M.D. On: 07/12/2021 11:19   MR BRAIN WO CONTRAST  Result Date: 07/10/2021 CLINICAL DATA:  Initial evaluation for mental status change, unknown  cause. EXAM: MRI HEAD WITHOUT  CONTRAST TECHNIQUE: Multiplanar, multiecho pulse sequences of the brain and surrounding structures were obtained without intravenous contrast. COMPARISON:  Prior head CT from earlier the same day. FINDINGS: Brain: Generalized age-related cerebral atrophy. There is abnormal restricted diffusion involving a large portion of the left MCA distribution, with involvement of the left frontal, parietal, and temporal lobes. Involvement of the left insular cortex as well as the underlying left basal ganglia and corona radiata. No significant regional mass effect at this time. Serpiginous susceptibility artifact within the area of infarction most consistent with petechial hemorrhage, although intraluminal thrombus within distal MCA branches could be contributory as well (series 14, image 32). No frank intraparenchymal hematoma. No other evidence for acute or subacute ischemia. Gray-white matter differentiation otherwise maintained. No encephalomalacia to suggest chronic cortical infarction. Few small remote lacunar infarcts noted about the basal ganglia and left thalamus. No mass lesion or midline shift. No hydrocephalus or extra-axial fluid collection. Pituitary gland and suprasellar region within normal limits. Midline structures intact. Vascular: Abnormal flow void seen within the left ICA to the level of the terminus, with extension into the left M1 segment and its distal branches. Again, above described susceptibility artifact at the level of the sylvian fissure could reflect petechial hemorrhage and/or intraluminal thrombus within distal MCA branches. Additionally, there is loss of normal flow void within the left V3/V4 segments, which could be related to slow flow and/or occlusion (series 10, image 6). Otherwise, major intracranial vascular flow voids are maintained. Skull and upper cervical spine: Craniocervical junction within normal limits. Bone marrow signal intensity normal. No scalp soft tissue abnormality.  Sinuses/Orbits: Sequelae of prior bilateral ocular lens replacement. Globes and orbital soft tissues demonstrate no other acute finding. Scattered mucosal thickening noted within the ethmoidal air cells. Enteric tube partially visualized. Associated layering fluid noted within the nasopharynx. No significant mastoid effusion. Inner ear structures grossly normal. Other: None. IMPRESSION: 1. Fairly large evolving acute ischemic left MCA distribution infarct as detailed above. Superimposed susceptibility artifact suggestive of associated petechial hemorrhage, although intraluminal thrombus within distal left MCA branches may be contributory as well. No frank intraparenchymal hematoma or significant regional mass effect at this time. 2. Abnormal flow void within the left ICA to the level of the terminus, likely occluded. Irregular flow void within the left MCA distribution could be related to occlusion and/or slow flow as well. 3. Few underlying small remote lacunar infarcts within the bilateral basal ganglia and left thalamus. 4. Abnormal flow void within the left the V3/V4 segments, which could be related to slow flow and/or occlusion. Critical Value/emergent results were called by telephone at the time of interpretation on 07/10/2021 at 1:35 am to provider Dr. Dub Amis, who verbally acknowledged these results. Electronically Signed   By: Rise Mu M.D.   On: 07/10/2021 01:51   DG CHEST PORT 1 VIEW  Result Date: 07/10/2021 CLINICAL DATA:  Pneumonia EXAM: PORTABLE CHEST 1 VIEW COMPARISON:  Prior chest x-ray yesterday 07/09/2021 FINDINGS: The patient is intubated. The tip of the endotracheal tube is 4.9 cm above the carina. A gastric tube is present. The tip is not visualized and lies off the field of view but is almost certainly within the stomach. Stable cardiac and mediastinal contours. Atherosclerotic calcifications in the transverse aorta. Patchy airspace opacities present in the right mid and  lower lung. Additionally, there is obscuration of the left hemidiaphragm consistent with left lower lobe airspace disease. Mild pulmonary vascular congestion without overt edema. Small  bilateral pleural effusions. No pneumothorax. IMPRESSION: 1. The tip of the endotracheal tube is 4.9 cm above the carina. 2. G-tube with the tip nonvisualized but below the diaphragm and likely in the stomach. 3. Small bilateral pleural effusions with associated lower lobe atelectasis. 4. Additional patchy airspace opacities in the right mid lung and both lung bases may reflect atelectasis or infiltrate given clinical history of pneumonia. Electronically Signed   By: Malachy Moan M.D.   On: 07/10/2021 08:06   DG Chest Portable 1 View  Result Date: 07/09/2021 CLINICAL DATA:  Post intubation EXAM: PORTABLE CHEST 1 VIEW COMPARISON:  Chest x-ray dated July 08, 2021 FINDINGS: Interval intubation with ET tube tip positioned approximately 3 cm from the carina. Cardiac and mediastinal contours are unchanged. Mild scattered linear opacities, likely atelectasis. No large pleural effusion or pneumothorax. IMPRESSION: Interval intubation with ET tube tip positioned approximately 3 cm from the carina. Electronically Signed   By: Allegra Lai MD   On: 07/09/2021 16:25   DG Abd Portable 1V  Result Date: 07/09/2021 CLINICAL DATA:  85 year old male with NG placement. EXAM: PORTABLE ABDOMEN - 1 VIEW COMPARISON:  None. FINDINGS: Partially visualized enteric tube with side-port in the body of the stomach and tip in the distal stomach. Dilated small bowel loops in the right lower abdomen measuring approximately 5 cm. IMPRESSION: 1. Enteric tube with tip in the distal stomach. 2. Dilated small bowel loops in the right lower abdomen. Electronically Signed   By: Elgie Collard M.D.   On: 07/09/2021 17:17   VAS US CAROTID  Result Date: 07/12/2021 Carotid Arterial Duplex Study Patient Name:  TASHI BAND  Date of Exam:   07/12/2021  Medical Rec #: [id number]      Accession #:    [id number] Date of Birth: 1936-08-28      Patient Gender: M Patient Age:   85Y Exam Location:  [facility] Procedure:      VAS US CAROTID Referring Phys: [id number] Martina Sinner --------------------------------------------------------------------------------  Indications:       CVA. Risk Factors:      Hypertension, hyperlipidemia. Other Factors:     COVID. Limitations        Today's exam was limited due to- patient intubated with head                    leaning to left side making left ICA interrogation                    challenging. Comparison Study:  No prior study Performing Technologist: Gertie Fey MHA, RDMS, RVT, RDCS  Examination Guidelines: A complete evaluation includes B-mode imaging, spectral Doppler, color Doppler, and power Doppler as needed of all accessible portions of each vessel. Bilateral testing is considered an integral part of a complete examination. Limited examinations for reoccurring indications may be performed as noted.  Right Carotid Findings: +----------+--------+--------+--------+--------------------------+--------+           PSV cm/sEDV cm/sStenosisPlaque Description        Comments +----------+--------+--------+--------+--------------------------+--------+ CCA Prox  83      10              focal and heterogenous             +----------+--------+--------+--------+--------------------------+--------+ CCA Distal80      13              heterogenous and irregular         +----------+--------+--------+--------+--------------------------+--------+ ICA Prox  236  48      40-59%  smooth and heterogenous            +----------+--------+--------+--------+--------------------------+--------+ ICA Mid   192     29                                                 +----------+--------+--------+--------+--------------------------+--------+ ICA Distal109     20                                                  +----------+--------+--------+--------+--------------------------+--------+ ECA       232     32      >50%    calcific                           +----------+--------+--------+--------+--------------------------+--------+ +----------+--------+-------+----------------+-------------------+           PSV cm/sEDV cmsDescribe        Arm Pressure (mmHG) +----------+--------+-------+----------------+-------------------+ [id number]            Multiphasic, WNL                    +----------+--------+-------+----------------+-------------------+ +---------+--------+--+--------+--+---------+ VertebralPSV cm/s72EDV cm/s16Antegrade +---------+--------+--+--------+--+---------+  Left Carotid Findings: +----------+--------+--------+--------+------------------+--------+           PSV cm/sEDV cm/sStenosisPlaque DescriptionComments +----------+--------+--------+--------+------------------+--------+ CCA Prox  42                                                 +----------+--------+--------+--------+------------------+--------+ CCA Distal17                                                 +----------+--------+--------+--------+------------------+--------+ ICA Prox  12                                                 +----------+--------+--------+--------+------------------+--------+ ICA Distal12                                                 +----------+--------+--------+--------+------------------+--------+ ECA       44                                                 +----------+--------+--------+--------+------------------+--------+ +----------+--------+--------+----------------+-------------------+           PSV cm/sEDV cm/sDescribe        Arm Pressure (mmHG) +----------+--------+--------+----------------+-------------------+ Subclavian129             Multiphasic, WNL                     +----------+--------+--------+----------------+-------------------+ +---------+--------+--+--------+-+---------+  VertebralPSV cm/s37EDV cm/s3Antegrade +---------+--------+--+--------+-+---------+ No diastolic flow with dampened systolic flow (thumped waveforms) throughout left carotid artery system.  Summary: Right Carotid: Velocities in the right ICA are consistent with a 40-59%                stenosis. Left Carotid: Limited evaluation of left ICA due to patient position. Left ICA               waveforms are suggestive of distal occlusion or near-occlusion. Vertebrals:  Bilateral vertebral arteries demonstrate antegrade flow. Subclavians: Normal flow hemodynamics were seen in bilateral subclavian              arteries. *See table(s) above for measurements and observations.  Electronically signed by Waverly Ferrari MD on 07/12/2021 at 1:31:16 PM.    Final    ECHOCARDIOGRAM LIMITED  Result Date: 07/11/2021    ECHOCARDIOGRAM LIMITED REPORT   Patient Name:   HILDRED MOLLICA Date of Exam: 07/11/2021 Medical Rec #:  [id number]     Height:       68.0 in Accession #:    [id number]    Weight:       188.7 lb Date of Birth:  10-07-1936     BSA:          1.994 m Patient Age:    85 years      BP:           131/69 mmHg Patient Gender: M             HR:           78 bpm. Exam Location:  Inpatient Procedure: 2D Echo, Limited Echo, Cardiac Doppler, Color Doppler and            Intracardiac Opacification Agent Indications:    I50.40* Unspecified combined systolic (congestive) and diastolic                 (congestive) heart failure  History:        Patient has prior history of Echocardiogram examinations, most                 recent 02/18/2021. CHF, Abnormal ECG, Signs/Symptoms:Altered                 Mental Status and Fever; Risk Factors:Dyslipidemia and                 Hypertension. Covid positive.  Sonographer:    Sheralyn Boatman RDCS Referring Phys: [id number] Martina Sinner  Sonographer Comments: Echo performed with patient  supine and on artificial respirator. IMPRESSIONS  1. Left ventricular ejection fraction, by estimation, is 25 to 30%. The left ventricle has severely decreased function. The left ventricle demonstrates regional wall motion abnormalities (suggestive of LAD disease). There is mild concentric left ventricular hypertrophy. There is no evidence of LV thrombus.  2. Moderate pericardial effusion. The pericardial effusion is posterior and lateral to the left ventricle. There is IVC dilation but with collapsability, respirophasic variation in valvular inflow velocities but without RV or RA collapse.  3. The inferior vena cava is dilated in size with >50% respiratory variability, suggesting right atrial pressure of 8 mmHg.  4. The mitral valve is grossly normal. No evidence of mitral valve regurgitation.  5. The aortic valve is tricuspid. There is mild calcification of the aortic valve. There is mild thickening of the aortic valve. Aortic valve regurgitation is not visualized. Mild aortic valve sclerosis is present, with no evidence of aortic valve stenosis.  6.  Tricuspid regurgitation signal is inadequate for assessing PA pressure. Comparison(s): A prior study was performed on 02/18/2021. No significant change from prior study. Conclusion(s)/Recommendation(s): Consider future repeat echo if concern for cardiac tamponade (not suggested on this exam). FINDINGS  Left Ventricle: Left ventricular ejection fraction, by estimation, is 25 to 30%. The left ventricle has severely decreased function. The left ventricle demonstrates regional wall motion abnormalities. Definity contrast agent was given IV to delineate the left ventricular endocardial borders. There is mild concentric left ventricular hypertrophy.  LV Wall Scoring: The mid and distal anterior wall, mid and distal lateral wall, mid and distal anterior septum, entire apex, mid anterolateral segment, and mid inferoseptal segment are hypokinetic. Right Ventricle: Tricuspid  regurgitation signal is inadequate for assessing PA pressure. Pericardium: A moderately sized pericardial effusion is present. The pericardial effusion is posterior and lateral to the left ventricle. Mitral Valve: The mitral valve is grossly normal. There is mild thickening of the mitral valve leaflet(s). There is mild calcification of the mitral valve leaflet(s). Tricuspid Valve: The tricuspid valve is normal in structure. Tricuspid valve regurgitation is mild. Aortic Valve: The aortic valve is tricuspid. There is mild calcification of the aortic valve. There is mild thickening of the aortic valve. Aortic valve regurgitation is not visualized. Mild aortic valve sclerosis is present, with no evidence of aortic valve stenosis. Aorta: The aortic root and ascending aorta are structurally normal, with no evidence of dilitation when indexed to age and body surface area. Venous: The inferior vena cava is dilated in size with greater than 50% respiratory variability, suggesting right atrial pressure of 8 mmHg. LEFT VENTRICLE PLAX 2D LVIDd:         5.70 cm LVIDs:         5.00 cm LV PW:         1.40 cm LV IVS:        1.20 cm LVOT diam:     2.30 cm LVOT Area:     4.15 cm  LV Volumes (MOD) LV vol d, MOD A2C: 201.0 ml LV vol d, MOD A4C: 209.0 ml LV vol s, MOD A2C: 148.0 ml LV vol s, MOD A4C: 188.0 ml LV SV MOD A2C:     53.0 ml LV SV MOD A4C:     209.0 ml LV SV MOD BP:      34.9 ml IVC IVC diam: 2.40 cm LEFT ATRIUM         Index LA diam:    4.30 cm 2.16 cm/m   AORTA Ao Root diam: 3.50 cm Ao Asc diam:  3.80 cm  SHUNTS Systemic Diam: 2.30 cm Riley Lam MD Electronically signed by Riley Lam MD Signature Date/Time: 07/11/2021/1:45:13 PM    Final     Microbiology Recent Results (from the past 240 hour(s))  Resp Panel by RT-PCR (Flu A&B, Covid) Nasopharyngeal Swab     Status: Abnormal   Collection Time: 11-Jul-2021 10:58 PM   Specimen: Nasopharyngeal Swab; Nasopharyngeal(NP) swabs in vial transport medium   Result Value Ref Range Status   SARS Coronavirus 2 by RT PCR POSITIVE (A) NEGATIVE Final    Comment: RESULT CALLED TO, READ BACK BY AND VERIFIED WITH: Riki Altes RN, AT 1610 07/09/21 D. VANHOOK (NOTE) SARS-CoV-2 target nucleic acids are DETECTED.  The SARS-CoV-2 RNA is generally detectable in upper respiratory specimens during the acute phase of infection. Positive results are indicative of the presence of the identified virus, but do not rule out bacterial infection or co-infection with other pathogens not  detected by the test. Clinical correlation with patient history and other diagnostic information is necessary to determine patient infection status. The expected result is Negative.  Fact Sheet for Patients: BloggerCourse.comhttps://www.fda.gov/media/152166/download  Fact Sheet for Healthcare Providers: SeriousBroker.ithttps://www.fda.gov/media/152162/download  This test is not yet approved or cleared by the [place] FDA and  has been authorized for detection and/or diagnosis of SARS-CoV-2 by FDA under an Emergency Use Authorization (EUA).  This EUA will remain in effect (meaning this test  can be used) for the duration of  the COVID-19 declaration under Section 564(b)(1) of the Act, 21 U.S.C. section 360bbb-3(b)(1), unless the authorization is terminated or revoked sooner.     Influenza A by PCR NEGATIVE NEGATIVE Final   Influenza B by PCR NEGATIVE NEGATIVE Final    Comment: (NOTE) The Xpert Xpress SARS-CoV-2/FLU/RSV plus assay is intended as an aid in the diagnosis of influenza from Nasopharyngeal swab specimens and should not be used as a sole basis for treatment. Nasal washings and aspirates are unacceptable for Xpert Xpress SARS-CoV-2/FLU/RSV testing.  Fact Sheet for Patients: BloggerCourse.comhttps://www.fda.gov/media/152166/download  Fact Sheet for Healthcare Providers: SeriousBroker.ithttps://www.fda.gov/media/152162/download  This test is not yet approved or cleared by the [place] FDA and has been authorized for  detection and/or diagnosis of SARS-CoV-2 by FDA under an Emergency Use Authorization (EUA). This EUA will remain in effect (meaning this test can be used) for the duration of the COVID-19 declaration under Section 564(b)(1) of the Act, 21 U.S.C. section 360bbb-3(b)(1), unless the authorization is terminated or revoked.  Performed at [facility], [address] [place], [place] [address]   MRSA Next Gen by PCR, Nasal     Status: None   Collection Time: 07/09/21  8:31 PM   Specimen: Nasal Mucosa; Nasal Swab  Result Value Ref Range Status   MRSA by PCR Next Gen NOT DETECTED NOT DETECTED Final    Comment: (NOTE) The GeneXpert MRSA Assay (FDA approved for NASAL specimens only), is one component of a comprehensive MRSA colonization surveillance program. It is not intended to diagnose MRSA infection nor to guide or monitor treatment for MRSA infections. Test performance is not FDA approved in patients less than 83 years old. Performed at [facility], [address] [place], [place] [address]     Lab Basic Metabolic Panel: Recent Labs  Lab 07/10/21 0008 07/10/21 0246 07/10/21 1831 07/11/21 0509 07/11/21 1655 07/12/21 0556 07/12/21 1805 07/13/21 0433 07/26/2021 0402  NA 143   < >  --  140  --  141 140 139 138  K 4.5   < >  --  3.7  --  3.5 3.7 3.5 3.6  CL 111  --   --  113*  --  110 105 106 101  CO2 23  --   --  21*  --  21* 25 23 23   GLUCOSE 140*  --   --  197*  --  193* 204* 181* 224*  BUN 41*  --   --  39*  --  38* 38* 40* 48*  CREATININE 2.02*  --   --  1.87*  --  1.69* 1.72* 1.71* 1.95*  CALCIUM 9.2  --   --  8.9  --  8.8* 9.0 9.0 9.1  MG 1.9  --  1.9 1.9 1.9 1.7  --   --   --   PHOS  --   --  2.9 1.9* 2.7 2.2*  --   --   --    < > =  values in this interval not displayed.   Liver Function Tests: Recent Labs  Lab 07/11/21 0509 07/12/21 0556 07/12/21 1805 07/13/21 0433 08/06/2021 0402  AST 32 ALT ALKPHOS 63 58 71 68 56   BILITOT 2.0* 1.7* 1.3* 1.7* 1.4*  PROT 5.6* 5.3* 5.7* 5.8* 5.7*  ALBUMIN 2.4* 2.1* 2.3* 2.2* 2.1*   No results for input(s): LIPASE, AMYLASE in the last 168 hours. No results for input(s): AMMONIA in the last 168 hours. CBC: Recent Labs  Lab 07/03/2021 2236 07/09/21 1540 07/10/21 0008 07/10/21 0246 07/11/21 0509 07/12/21 0556 07/13/21 0433 08/10/2021 0402  WBC 6.0   < > 14.7*  --  21.0* 11.8* 10.2 8.1  NEUTROABS 4.6  --   --   --   --  10.2*  --   --   HGB 11.8*   < > 14.0 12.2* 12.6* 12.0* 12.0* 12.3*  HCT 35.1*   < > 42.2 36.0* 37.2* 35.9* 36.1* 37.0*  MCV 100.0   < > 99.5  --  93.9 93.2 92.8 93.0  PLT 319   < > 283  --  297 236 286 255   < > = values in this interval not displayed.   Cardiac Enzymes: No results for input(s): CKTOTAL, CKMB, CKMBINDEX, TROPONINI in the last 168 hours. Sepsis Labs: Recent Labs  Lab 06/24/2021 2236 07/09/21 0600 07/09/21 1540 07/10/21 0132 07/11/21 0509 07/12/21 0556 07/13/21 0433 07/24/2021 0402  PROCALCITON  --   --   --  1.12  --   --   --   --   WBC 6.0  --    < >  --  21.0* 11.8* 10.2 8.1  LATICACIDVEN 1.4 0.8  --   --   --   --   --   --    < > = values in this interval not displayed.    Procedures/Operations  Endotracheal intubation   Steffanie Dunn 2021/07/22, 3:09 PM

## 2021-08-13 NOTE — Procedures (Signed)
Extubation Procedure Note  Patient Details:   Name: Tristan Hart DOB: May 28, 1936 MRN: [id number]   Airway Documentation:  Airway 7.5 mm (Active)  Secured at (cm) 25 cm 08/07/2021 1147  Measured From Lips 07/19/2021 1147  Secured Location Right 08/01/2021 1147  Secured By [employer] 08/01/2021 1147  Tube Holder Repositioned Yes 07/13/2021 1147  Prone position No 08/11/2021 1147  Cuff Pressure (cm H2O) Green OR 18-26 [facility] 07/13/2021 0743  Site Condition Dry 08/03/2021 1147   Vent end date: (not recorded) Vent end time: (not recorded)   Evaluation  O2 sats: currently acceptable Complications: No apparent complications Patient did tolerate procedure well. Bilateral Breath Sounds: Diminished   No  Toula Moos 07/26/2021, 5:25 PM

## 2021-08-13 DEATH — deceased
